# Patient Record
Sex: Female | Born: 1950 | Race: White | Hispanic: No | Marital: Married | Smoking: Never smoker
Health system: Southern US, Community
[De-identification: ages and names within clinical notes are randomized; demographics above are authoritative.]

## PROBLEM LIST (undated history)

## (undated) ENCOUNTER — Emergency Department (HOSPITAL_COMMUNITY): Admission: EM | Payer: Medicare PPO | Source: Home / Self Care

## (undated) DIAGNOSIS — C50919 Malignant neoplasm of unspecified site of unspecified female breast: Secondary | ICD-10-CM

## (undated) DIAGNOSIS — I429 Cardiomyopathy, unspecified: Secondary | ICD-10-CM

## (undated) DIAGNOSIS — Z860100 Personal history of colon polyps, unspecified: Secondary | ICD-10-CM

## (undated) DIAGNOSIS — E785 Hyperlipidemia, unspecified: Secondary | ICD-10-CM

## (undated) DIAGNOSIS — C801 Malignant (primary) neoplasm, unspecified: Secondary | ICD-10-CM

## (undated) DIAGNOSIS — M8580 Other specified disorders of bone density and structure, unspecified site: Secondary | ICD-10-CM

## (undated) DIAGNOSIS — F419 Anxiety disorder, unspecified: Secondary | ICD-10-CM

## (undated) HISTORY — DX: Other specified disorders of bone density and structure, unspecified site: M85.80

## (undated) HISTORY — DX: Hyperlipidemia, unspecified: E78.5

## (undated) HISTORY — DX: Anxiety disorder, unspecified: F41.9

## (undated) HISTORY — DX: Personal history of colon polyps, unspecified: Z86.0100

## (undated) HISTORY — PX: MASTECTOMY: SHX3

## (undated) HISTORY — DX: Personal history of colonic polyps: Z86.010

---

## 1998-04-03 ENCOUNTER — Other Ambulatory Visit: Admission: RE | Admit: 1998-04-03 | Discharge: 1998-04-03 | Payer: Self-pay | Admitting: *Deleted

## 1998-11-06 ENCOUNTER — Encounter: Admission: RE | Admit: 1998-11-06 | Discharge: 1998-11-27 | Payer: Self-pay | Admitting: Oncology

## 1998-12-31 ENCOUNTER — Other Ambulatory Visit: Admission: RE | Admit: 1998-12-31 | Discharge: 1998-12-31 | Payer: Self-pay | Admitting: Gastroenterology

## 1999-01-19 ENCOUNTER — Ambulatory Visit (HOSPITAL_COMMUNITY): Admission: RE | Admit: 1999-01-19 | Discharge: 1999-01-19 | Payer: Self-pay | Admitting: Gastroenterology

## 1999-09-16 ENCOUNTER — Other Ambulatory Visit: Admission: RE | Admit: 1999-09-16 | Discharge: 1999-09-16 | Payer: Self-pay | Admitting: *Deleted

## 2000-09-21 ENCOUNTER — Other Ambulatory Visit: Admission: RE | Admit: 2000-09-21 | Discharge: 2000-09-21 | Payer: Self-pay | Admitting: *Deleted

## 2001-11-28 ENCOUNTER — Other Ambulatory Visit: Admission: RE | Admit: 2001-11-28 | Discharge: 2001-11-28 | Payer: Self-pay | Admitting: Obstetrics and Gynecology

## 2002-04-04 ENCOUNTER — Ambulatory Visit (HOSPITAL_COMMUNITY): Admission: RE | Admit: 2002-04-04 | Discharge: 2002-04-04 | Payer: Self-pay | Admitting: Gastroenterology

## 2014-04-11 ENCOUNTER — Emergency Department (HOSPITAL_COMMUNITY): Payer: Medicare PPO

## 2014-04-11 ENCOUNTER — Encounter (HOSPITAL_COMMUNITY): Payer: Self-pay | Admitting: Emergency Medicine

## 2014-04-11 ENCOUNTER — Emergency Department (HOSPITAL_COMMUNITY)
Admission: EM | Admit: 2014-04-11 | Discharge: 2014-04-11 | Disposition: A | Discharge status: Home / Self Care | Payer: Medicare PPO | Attending: Emergency Medicine | Admitting: Emergency Medicine

## 2014-04-11 DIAGNOSIS — S52501A Unspecified fracture of the lower end of right radius, initial encounter for closed fracture: Secondary | ICD-10-CM | POA: Diagnosis not present

## 2014-04-11 DIAGNOSIS — Y9301 Activity, walking, marching and hiking: Secondary | ICD-10-CM | POA: Diagnosis not present

## 2014-04-11 DIAGNOSIS — Z853 Personal history of malignant neoplasm of breast: Secondary | ICD-10-CM | POA: Insufficient documentation

## 2014-04-11 DIAGNOSIS — Y998 Other external cause status: Secondary | ICD-10-CM | POA: Diagnosis not present

## 2014-04-11 DIAGNOSIS — T1490XA Injury, unspecified, initial encounter: Secondary | ICD-10-CM

## 2014-04-11 DIAGNOSIS — W010XXA Fall on same level from slipping, tripping and stumbling without subsequent striking against object, initial encounter: Secondary | ICD-10-CM | POA: Insufficient documentation

## 2014-04-11 DIAGNOSIS — S6981XA Other specified injuries of right wrist, hand and finger(s), initial encounter: Secondary | ICD-10-CM | POA: Diagnosis present

## 2014-04-11 DIAGNOSIS — Y9289 Other specified places as the place of occurrence of the external cause: Secondary | ICD-10-CM | POA: Insufficient documentation

## 2014-04-11 HISTORY — DX: Malignant (primary) neoplasm, unspecified: C80.1

## 2014-04-11 HISTORY — DX: Malignant neoplasm of unspecified site of unspecified female breast: C50.919

## 2014-04-11 MED ORDER — HYDROCODONE-ACETAMINOPHEN 5-325 MG PO TABS: 1.0000 | ORAL_TABLET | ORAL | Status: DC | PRN

## 2014-04-11 NOTE — ED Provider Notes (Signed)
CSN: [id number]     Arrival date & time 04/11/14  1433 History  This chart is scribed for non-physician practitioner, Clayton Bibles, PA-C, working with Varney Biles, MD by Chester Holstein, ED Scribe.  This patient was seen in room WTR6/WTR6 and the patient's care was started 3:20 PM.    Chief Complaint  Patient presents with  . Wrist Injury   Patient is a 63 y.o. female presenting with wrist injury. The history is provided by the patient. No language interpreter was used.  Wrist Injury   HPI Comments: Jenna Stewart is a 63 y.o. female who presents to the Emergency Department complaining of constant right wrist pain following a fall PTA. Pt notes she used her arm to stop impact with the concrete ground ([place]).  She states her wrist is painful to move, but otherwise has no pain. She denies numbness in fingers. Pt notes she initially felt lightheaded, but was ok after resting for 1-2 minutes. Pt is right handed and is not currently employed. Pt denies LOC, disorientation, confusion after the fall.  Past Medical History  Diagnosis Date  . Cancer   . Breast cancer    Past Surgical History  Procedure Laterality Date  . Mastectomy     No family history on file. History  Substance Use Topics  . Smoking status: Not on file  . Smokeless tobacco: Not on file  . Alcohol Use: Not on file   OB History    No data available     Review of Systems  Musculoskeletal: Positive for joint swelling and arthralgias.  Neurological: Positive for dizziness and numbness. Negative for syncope and weakness.  All other systems reviewed and are negative.     Allergies  Review of patient's allergies indicates not on file.  Home Medications   Prior to Admission medications   Not on File   BP 140/57 mmHg  Pulse 94  Temp(Src) 97.1 F (36.2 C) (Oral)  Resp 14  Ht 5\' 5"  (1.651 m)  Wt 132 lb (59.875 kg)  BMI 21.97 kg/m2  SpO2 100% Physical Exam  Constitutional: She is oriented to person, place,  and time. She appears well-developed and well-nourished. No distress.  HENT:  Head: Normocephalic and atraumatic.  Eyes: Conjunctivae are normal.  Neck: Normal range of motion. Neck supple.  Cardiovascular: Intact distal pulses.   Capillary refill less than 2 sec, radial pulses intact.  Pulmonary/Chest: Effort normal.  Musculoskeletal: Normal range of motion.  Right wrist with swelling, diffused tenderness over dorsal aspect. Decreased ROM secondary to pain. No other bony tenderness, ROM normal in all digits.   Spine nontender, no crepitus, or stepoffs.   Neurological: She is alert and oriented to person, place, and time.  Sensations intact  Skin: Skin is warm and dry. She is not diaphoretic.  Psychiatric: She has a normal mood and affect. Her behavior is normal.  Nursing note and vitals reviewed.   ED Course  Procedures (including critical care time) DIAGNOSTIC STUDIES: Oxygen Saturation is 100% on room air, normal by my interpretation.    COORDINATION OF CARE: 3:26 PM Discussed treatment plan with patient at beside, the patient agrees with the plan and has no further questions at this time.   Labs Review Labs Reviewed - No data to display  Imaging Review Dg Wrist Complete Right  04/11/2014   CLINICAL DATA:  Pain and wrist deformity secondary to a fall at home.  EXAM: RIGHT WRIST - COMPLETE 3+ VIEW  COMPARISON:  None.  FINDINGS:  There is a dorsally impacted slightly comminuted fracture of the metaphysis of the distal right radius. The ulna and the carpal bones are intact. There is no definitive involvement of the distal radial articular surface.  IMPRESSION: Dorsally impacted comminuted fracture of the metaphysis of the distal right radius.   Electronically Signed   By: Rozetta Nunnery M.D.   On: 04/11/2014 15:19   Dg Hand 2 View Right  04/11/2014   CLINICAL DATA:  Pain and deformities secondary to a fall at home today.  EXAM: RIGHT HAND - 2 VIEW  COMPARISON:  None.  FINDINGS:  There is a dorsally impacted comminuted fracture of the metaphysis of the distal right radius. Bones of the hand are intact. Distal ulna is intact.  IMPRESSION: Distal radial fracture.   Electronically Signed   By: Rozetta Nunnery M.D.   On: 04/11/2014 15:20     EKG Interpretation None     3:30 PM Discussed Pt and plan with Dr. Kathrynn Humble.  MDM   Final diagnoses:  Distal radius fracture, right, closed, initial encounter   Afebrile, nontoxic patient with [place], right wrist injury.  Distal right radius fracture sustained.  No other injury.  Neurovascularly intact.   D/C home with sugartong splint, pain medication, hand surgery follow up.   Discussed result, findings, treatment, and follow up  with patient.  Pt given return precautions.  Pt verbalizes understanding and agrees with plan.       I personally performed the services described in this documentation, which was scribed in my presence. The recorded information has been reviewed and is accurate.     Clayton Bibles, PA-C 04/11/14 [place], MD 04/11/14 2203

## 2014-04-11 NOTE — Discharge Instructions (Signed)
Read the information below.  Use the prescribed medication as directed.  Please discuss all new medications with your pharmacist.  Do not take additional tylenol while taking the prescribed pain medication to avoid overdose.  You may return to the Emergency Department at any time for worsening condition or any new symptoms that concern you.  If you develop uncontrolled pain, weakness or numbness of the extremity, severe discoloration of the skin, or you are unable to move your fingers, return to the ER for a recheck.       Forearm Fracture Your caregiver has diagnosed you as having a broken bone (fracture) of the forearm. This is the part of your arm between the elbow and your wrist. Your forearm is made up of two bones. These are the radius and ulna. A fracture is a break in one or both bones. A cast or splint is used to protect and keep your injured bone from moving. The cast or splint will be on generally for about 5 to 6 weeks, with individual variations. HOME CARE INSTRUCTIONS   Keep the injured part elevated while sitting or lying down. Keeping the injury above the level of your heart (the center of the chest). This will decrease swelling and pain.  Apply ice to the injury for 15-20 minutes, 03-04 times per day while awake, for 2 days. Put the ice in a plastic bag and place a thin towel between the bag of ice and your cast or splint.  If you have a plaster or fiberglass cast:  Do not try to scratch the skin under the cast using sharp or pointed objects.  Check the skin around the cast every day. You may put lotion on any red or sore areas.  Keep your cast dry and clean.  If you have a plaster splint:  Wear the splint as directed.  You may loosen the elastic around the splint if your fingers become numb, tingle, or turn cold or blue.  Do not put pressure on any part of your cast or splint. It may break. Rest your cast only on a pillow the first 24 hours until it is fully  hardened.  Your cast or splint can be protected during bathing with a plastic bag. Do not lower the cast or splint into water.  Only take over-the-counter or prescription medicines for pain, discomfort, or fever as directed by your caregiver. SEEK IMMEDIATE MEDICAL CARE IF:   Your cast gets damaged or breaks.  You have more severe pain or swelling than you did before the cast.  Your skin or nails below the injury turn blue or gray, or feel cold or numb.  There is a bad smell or new stains and/or pus like (purulent) drainage coming from under the cast. MAKE SURE YOU:   Understand these instructions.  Will watch your condition.  Will get help right away if you are not doing well or get worse. Document Released: 05/07/2000 Document Revised: 08/02/2011 Document Reviewed: 12/28/2007 [facility] Patient Information 2015 [place], [place]. This information is not intended to replace advice given to you by your health care provider. Make sure you discuss any questions you have with your health care provider.  Cast or Splint Care Casts and splints support injured limbs and keep bones from moving while they heal. It is important to care for your cast or splint at home.  HOME CARE INSTRUCTIONS  Keep the cast or splint uncovered during the drying period. It can take 24 to 48 hours to  dry if it is made of plaster. A fiberglass cast will dry in less than 1 hour.  Do not rest the cast on anything harder than a pillow for the first 24 hours.  Do not put weight on your injured limb or apply pressure to the cast until your health care provider gives you permission.  Keep the cast or splint dry. Wet casts or splints can lose their shape and may not support the limb as well. A wet cast that has lost its shape can also create harmful pressure on your skin when it dries. Also, wet skin can become infected.  Cover the cast or splint with a plastic bag when bathing or when out in the rain or snow. If the  cast is on the trunk of the body, take sponge baths until the cast is removed.  If your cast does become wet, dry it with a towel or a blow dryer on the cool setting only.  Keep your cast or splint clean. Soiled casts may be wiped with a moistened cloth.  Do not place any hard or soft foreign objects under your cast or splint, such as cotton, toilet paper, lotion, or powder.  Do not try to scratch the skin under the cast with any object. The object could get stuck inside the cast. Also, scratching could lead to an infection. If itching is a problem, use a blow dryer on a cool setting to relieve discomfort.  Do not trim or cut your cast or remove padding from inside of it.  Exercise all joints next to the injury that are not immobilized by the cast or splint. For example, if you have a long leg cast, exercise the hip joint and toes. If you have an arm cast or splint, exercise the shoulder, elbow, thumb, and fingers.  Elevate your injured arm or leg on 1 or 2 pillows for the first 1 to 3 days to decrease swelling and pain.It is best if you can comfortably elevate your cast so it is higher than your heart. SEEK MEDICAL CARE IF:   Your cast or splint cracks.  Your cast or splint is too tight or too loose.  You have unbearable itching inside the cast.  Your cast becomes wet or develops a soft spot or area.  You have a bad smell coming from inside your cast.  You get an object stuck under your cast.  Your skin around the cast becomes red or raw.  You have new pain or worsening pain after the cast has been applied. SEEK IMMEDIATE MEDICAL CARE IF:   You have fluid leaking through the cast.  You are unable to move your fingers or toes.  You have discolored (blue or white), cool, painful, or very swollen fingers or toes beyond the cast.  You have tingling or numbness around the injured area.  You have severe pain or pressure under the cast.  You have any difficulty with your  breathing or have shortness of breath.  You have chest pain. Document Released: 05/07/2000 Document Revised: 02/28/2013 Document Reviewed: 11/16/2012 [facility] Patient Information 2015 [place], [place]. This information is not intended to replace advice given to you by your health care provider. Make sure you discuss any questions you have with your health care provider.

## 2014-04-11 NOTE — ED Notes (Signed)
Pt reports falling today when walking outside. Pt presents with obvious  right wrist deformity and swelling. Pt is on aspirin daily 81 mg. Pt denies head injury or loc.

## 2014-05-30 ENCOUNTER — Ambulatory Visit: Payer: Medicare PPO

## 2014-06-03 ENCOUNTER — Ambulatory Visit: Payer: Medicare PPO | Attending: Orthopaedic Surgery | Admitting: Occupational Therapy

## 2014-06-03 ENCOUNTER — Encounter: Payer: Self-pay | Admitting: Occupational Therapy

## 2014-06-03 DIAGNOSIS — X58XXXD Exposure to other specified factors, subsequent encounter: Secondary | ICD-10-CM | POA: Insufficient documentation

## 2014-06-03 DIAGNOSIS — S62101D Fracture of unspecified carpal bone, right wrist, subsequent encounter for fracture with routine healing: Secondary | ICD-10-CM | POA: Insufficient documentation

## 2014-06-03 DIAGNOSIS — S62101B Fracture of unspecified carpal bone, right wrist, initial encounter for open fracture: Secondary | ICD-10-CM

## 2014-06-03 NOTE — Therapy (Signed)
[facility] [address] [place], [place], [address] Phone: [phone]   Fax:  [phone]  Occupational Therapy Evaluation  Patient Details  Name: Jenna Stewart MRN: [id number] Date of Birth: 1950-09-06 Referring Provider:  Garald Balding, MD  Encounter Date: 06/03/2014      OT End of Session - 06/03/14 1700    Visit Number 1   Number of Visits 1   OT Start Time 1616   OT Stop Time 1658   OT Time Calculation (min) 42 min   Activity Tolerance Patient tolerated treatment well      Past Medical History  Diagnosis Date  . Cancer   . Breast cancer     Past Surgical History  Procedure Laterality Date  . Mastectomy      There were no vitals taken for this visit.  Visit Diagnosis:  Wrist fracture, right, open, initial encounter - Plan: Ot plan of care cert/re-cert      Subjective Assessment - 06/03/14 1620    Symptoms I was referred for maximum range of motion   Pertinent History see epic snapshot   Currently in Pain? No/denies          [facility] OT Assessment - 06/03/14 1621    Assessment   Diagnosis L wrist fracture   Onset Date 04/16/15   Prior Therapy none   Precautions   Precautions Other (comment)  no strengthening; AROM only at this time per MD   Required Braces or Orthoses --  pt no longer wearing brace   Restrictions   Weight Bearing Restrictions --  no strengthening at this time   Balance Screen   Has the patient fallen in the past 6 months No   Prior Function   Level of Independence Independent with basic ADLs;Independent with homemaking with ambulation   Vocation Retired   ADL   Eating/Feeding Independent   Grooming Independent   Upper Body Bathing Modified independent   [name]   Upper Body Dressing Independent   Lower Body Dressing Independent   Toilet Desloge   ADL comments pt able to complete all ADL activities as she was before.     IADL   Shopping Takes care of all shopping needs independently   Light Housekeeping Does personal laundry completely;Maintains house alone or with occasional assistance   Meal Prep Plans, prepares and serves adequate meals independently  occassional difficulty opening jars   Written Expression   Dominant Hand Right   Handwriting 100% legible   Sensation   Light Touch Appears Intact   Hot/Cold Appears Intact   Additional Comments Pt reports no tingling or impaired sensation during this episode   Coordination   Gross Motor Movements are Fluid and Coordinated Yes   Other 9 hole peg 25.96   Coordination pt reports no functional problems with coordination   Edema   Edema no edema at this point   AROM   Overall AROM  Deficits   Overall AROM Comments Pt with 75% wrist extension, 70% wrist flexion, ulnar deviation WFL, radial deviation 80%, supination 50%, All other AROM WFL's.  MD has not cleared pt for strengthening or PROM at this time.     RUE Strength   Gross Grasp --  unable to assess due to not cleared for strengthening  OT Education - 06/03/14 1700    Education provided Yes   Education Details HEP to increase ROM   Person(s) Educated Patient   Methods Explanation;Demonstration;Handout   Comprehension Verbalized understanding;Returned demonstration          OT Short Term Goals - 06/03/14 1705    OT SHORT TERM GOAL #1   Title --  N/A           OT Long Term Goals - 06/03/14 1705    OT LONG TERM GOAL #1   Title --  N/A               Plan - 06/03/14 1700    Clinical Impression Statement Pt is a 64 year old female s/p fall with wrist fracture. Pt is now presenting with decreased AROM and some limited functional strength in right dominant hand.  MD has only cleared pt for AROM (no strengthening).  Provided pt with AROM HEP and  suggested that when she sees MD in 4 weeks  if she is noticing strength limitations that she may want to consider re eval at that point if MD will allow strengthening. Pt in agreement.   Clinical Impairments Affecting Rehab Potential limited ROM and strength.  See comments above. Pt does not require further skilled OT at this time.   OT Frequency --  N/A   OT Duration --  N/A   OT Treatment/Interventions --  N/A   Plan Provided HEP;  pt does not require any additional skilled OT at this time.          Problem List There are no active problems to display for this patient.   Quay Burow, OTR/L 06/03/2014, 5:08 PM  [facility] [address] [place], [place], [address] Phone: [phone]   Fax:  [phone]

## 2014-06-03 NOTE — Patient Instructions (Signed)
Home Exercise Program:  Do 1-2 times per day as tolerance allows.  Always do within the limits of your pain.  A stretch is fine, pain is not.  STOP if you have pain.  1.  Wrist flexion: Rest hand on table with thumb facing the ceiling.  Bend wrist forward as far as you can (your fingers will open and this is ok).  Hold for count of 5, then return to starting position.  Build up to 15 repetitions, rest, do 10 more.  2. Wrist extension:  Rest hand on table with thumb facing the ceiling.  Bend wrist back as far as you can (your fingers will close and this is ok). Hold for count of 5, then return to starting position.  Build up to 15 repetitions, rest, do 10 more.  3. Radial deviation:  Place hands face down.  Use left hand to stabilize right forearm.  Turn thumb toward your belly.  Hold for count of 5, return to starting position. Build up to 15 repetitions, rest, do 10 more.  4. Supination:  Rest arms on table, palms down.  With both hands at the same time, turn palms up facing toward the ceiling as far as you can go. Try not to turn elbows in or bend at the waist to get your hand over.  Hold for count of 5, return to resting position. Build up to 15 repetitions, rest, do 10 more.

## 2015-03-13 DIAGNOSIS — Z23 Encounter for immunization: Secondary | ICD-10-CM | POA: Diagnosis not present

## 2015-03-13 DIAGNOSIS — R197 Diarrhea, unspecified: Secondary | ICD-10-CM | POA: Diagnosis not present

## 2015-04-09 DIAGNOSIS — R197 Diarrhea, unspecified: Secondary | ICD-10-CM | POA: Diagnosis not present

## 2015-04-21 DIAGNOSIS — Z853 Personal history of malignant neoplasm of breast: Secondary | ICD-10-CM | POA: Diagnosis not present

## 2015-04-21 DIAGNOSIS — M8589 Other specified disorders of bone density and structure, multiple sites: Secondary | ICD-10-CM | POA: Diagnosis not present

## 2015-04-21 DIAGNOSIS — Z1231 Encounter for screening mammogram for malignant neoplasm of breast: Secondary | ICD-10-CM | POA: Diagnosis not present

## 2017-11-01 ENCOUNTER — Encounter (HOSPITAL_COMMUNITY): Payer: Self-pay | Admitting: Family Medicine

## 2017-11-01 ENCOUNTER — Ambulatory Visit (HOSPITAL_COMMUNITY)
Admission: EM | Admit: 2017-11-01 | Discharge: 2017-11-01 | Disposition: A | Discharge status: Home / Self Care | Payer: Medicare Other | Attending: Family Medicine | Admitting: Family Medicine

## 2017-11-01 DIAGNOSIS — S0101XA Laceration without foreign body of scalp, initial encounter: Secondary | ICD-10-CM

## 2017-11-01 DIAGNOSIS — S0990XA Unspecified injury of head, initial encounter: Secondary | ICD-10-CM

## 2017-11-01 DIAGNOSIS — W208XXA Other cause of strike by thrown, projected or falling object, initial encounter: Secondary | ICD-10-CM | POA: Diagnosis not present

## 2017-11-01 NOTE — ED Provider Notes (Signed)
[facility]    CSN: [id number] Arrival date & time: 11/01/17  1403     History   Chief Complaint Chief Complaint  Patient presents with  . Head Injury    HPI Jenna Stewart is a 67 y.o. female.   67 year old female comes in for head injury and laceration to the scalp.  States she was putting down a dead branch when another branch fell and hit her head.  She denies loss of consciousness.  Denies dizziness, weakness, syncope.  Denies nausea, vomiting.  Denies photophobia, blurry vision.  Denies blood thinner use.  Applied pressure to the scalp wound, and followed up here for evaluation.  Tetanus up-to-date.      Past Medical History:  Diagnosis Date  . Breast cancer ([place])   . Cancer ([place])     There are no active problems to display for this patient.   Past Surgical History:  Procedure Laterality Date  . MASTECTOMY      OB History   None      Home Medications    Prior to Admission medications   Medication Sig Start Date End Date Taking? Authorizing Provider  calcium carbonate 1250 MG capsule Take 1,250 mg by mouth 2 (two) times daily with a meal.    [provider]  FLUoxetine (PROZAC) 20 MG capsule Take 20 mg by mouth daily.    [provider]  HYDROcodone-acetaminophen (NORCO/VICODIN) 5-325 MG per tablet Take 1-2 tablets by mouth every 4 (four) hours as needed for moderate pain or severe pain. Patient not taking: Reported on 06/03/2014 04/11/14   Clayton Bibles, PA-C  niacin (NIASPAN) 750 MG CR tablet Take 750 mg by mouth at bedtime.    [provider]  raloxifene (EVISTA) 60 MG tablet Take 60 mg by mouth daily.    [provider]    Family History History reviewed. No pertinent family history.  Social History Social History   Tobacco Use  . Smoking status: Not on file  Substance Use Topics  . Alcohol use: Not on file  . Drug use: Not on file     Allergies   Patient has no known  allergies.   Review of Systems Review of Systems  Reason unable to perform ROS: See HPI as above.     Physical Exam Triage Vital Signs ED Triage Vitals [11/01/17 1505]  Enc Vitals Group     BP (!) 145/69     Pulse Rate 84     Resp 18     Temp      Temp src      SpO2 100 %     Weight      Height      Head Circumference      Peak Flow      Pain Score      Pain Loc      Pain Edu?      Excl. in [place]?    No data found.  Updated Vital Signs BP (!) 145/69   Pulse 84   Resp 18   SpO2 100%   Physical Exam  Constitutional: She is oriented to person, place, and time. She appears well-developed and well-nourished. No distress.  HENT:  Head: Normocephalic and atraumatic.  Eyes: Pupils are equal, round, and reactive to light. Conjunctivae and EOM are normal.  Neck: Normal range of motion. Neck supple.  Cardiovascular: Normal rate and regular rhythm. Exam reveals no gallop and no friction rub.  No murmur heard. Pulmonary/Chest:  Effort normal and breath sounds normal. No stridor. No respiratory distress. She has no wheezes. She has no rales.  Neurological: She is alert and oriented to person, place, and time. She has normal strength. She is not disoriented. No cranial nerve deficit or sensory deficit. She displays a negative Romberg sign. Coordination and gait normal. GCS eye subscore is 4. GCS verbal subscore is 5. GCS motor subscore is 6.  Normal finger-to-nose, heel-to-shin, rapid movement.  Able to ambulate without difficulty or hesitancy.  Able to walk on tiptoes, heels, toe to heels.  Skin: Skin is warm and dry.  3 cm laceration to the right parietal scalp.  Bleeding continues.  No foreign body seen.     UC Treatments / Results  Labs (all labs ordered are listed, but only abnormal results are displayed) Labs Reviewed - No data to display  EKG None  Radiology No results found.  Procedures Laceration Repair Date/Time: 11/01/2017 8:04 PM Performed by: Ok Edwards,  PA-C Authorized by: Raylene Everts, MD   Consent:    Consent obtained:  Verbal   Consent given by:  Patient   Risks discussed:  Infection, pain, poor cosmetic result, poor wound healing and retained foreign body   Alternatives discussed:  Referral Anesthesia (see MAR for exact dosages):    Anesthesia method:  Local infiltration   Local anesthetic:  Lidocaine 2% WITH epi Laceration details:    Location:  Scalp   Scalp location:  R parietal   Length (cm):  3   Depth (mm):  2 Repair type:    Repair type:  Simple Pre-procedure details:    Preparation:  Patient was prepped and draped in usual sterile fashion Exploration:    Hemostasis achieved with:  Epinephrine   Wound exploration: entire depth of wound probed and visualized   Treatment:    Area cleansed with:  Hibiclens   Amount of cleaning:  Extensive   Irrigation solution:  Sterile saline   Irrigation method:  Pressure wash, tap and syringe   Visualized foreign bodies/material removed: no   Skin repair:    Repair method:  Staples   Number of staples:  4 Approximation:    Approximation:  Close Post-procedure details:    Dressing:  Open (no dressing)   Patient tolerance of procedure:  Tolerated well, no immediate complications   (including critical care time)  Medications Ordered in UC Medications - No data to display  Initial Impression / Assessment and Plan / UC Course  I have reviewed the triage vital signs and the nursing notes.  Pertinent labs & imaging results that were available during my care of the patient were reviewed by me and considered in my medical decision making (see chart for details).    Normal neurology exam without deficits.  Long discussion with patient about worries of head injury and brain bleed with patients over 74 years old.  Discussed going to the emergency department for further evaluation.  Patient without dizziness, headache, blurry vision, confusion, weakness at this time. She would  like to defer going to the emergency department, and to follow-up with primary care instead. Will have patient follow-up in 1 to 2 days for reevaluation, and assessment for possible CT scan.   Scalp laceration repaired, 4 staples applied.  Wound care instructions given. Follow-up here with PCP in 7 days for staple removal.  Strict return precautions given.  Patient expresses understanding and agrees to plan.  Final Clinical Impressions(s) / UC Diagnoses   Final diagnoses:  Injury of head, initial encounter  Laceration of scalp, initial encounter    ED Prescriptions    None        Arturo Morton 11/01/17 2009

## 2017-11-01 NOTE — ED Triage Notes (Signed)
Pt here for head injury and laceration to head. She was pulling down a dead branch and another branch came with it striking her in the head. She denies any dizziness, LOC, blurred vision.

## 2017-11-01 NOTE — Discharge Instructions (Signed)
4 staples applied today. Do not get the wound wet for the first 24 hours. Afterwards, you can wash your hair as normal. Do not soak wound. Monitor for spreading redness, increased warmth, drainage, follow up for reevaluation. Follow up here or with PCP in 7 days for staple removal.  As discussed, cannot rule out brain bleed given your head injury and age.  However, given no symptoms, you can follow-up with your primary care in 1 to 2 days for reevaluation, and possible CT scan. If experiencing headache/blurry vision, nausea/vomiting, confusion/altered mental status, dizziness, weakness, passing out, imbalance, go to the emergency department for further evaluation.

## 2018-05-16 ENCOUNTER — Encounter (HOSPITAL_COMMUNITY): Payer: Self-pay | Admitting: Emergency Medicine

## 2018-05-16 ENCOUNTER — Other Ambulatory Visit: Payer: Self-pay

## 2018-05-16 ENCOUNTER — Ambulatory Visit (HOSPITAL_COMMUNITY)
Admission: EM | Admit: 2018-05-16 | Discharge: 2018-05-16 | Disposition: A | Discharge status: Home / Self Care | Payer: Medicare Other | Attending: Family Medicine | Admitting: Family Medicine

## 2018-05-16 DIAGNOSIS — S0502XA Injury of conjunctiva and corneal abrasion without foreign body, left eye, initial encounter: Secondary | ICD-10-CM | POA: Diagnosis not present

## 2018-05-16 MED ORDER — TOBRAMYCIN 0.3 % OP OINT: 1.0000 "application " | TOPICAL_OINTMENT | Freq: Three times a day (TID) | OPHTHALMIC | 0 refills | Status: DC

## 2018-05-16 MED ORDER — EYE WASH OPHTH SOLN
OPHTHALMIC | Status: AC
Start: 2018-05-16 — End: 2018-05-16
  Filled 2018-05-16: qty 236

## 2018-05-16 NOTE — ED Triage Notes (Signed)
PT was mowing and a branch struck her in the left eye. PT reports pain in eye and slight blurred vision.

## 2018-05-16 NOTE — ED Provider Notes (Signed)
[place]    CSN: [id number] Arrival date & time: 05/16/18  1621     History   Chief Complaint Chief Complaint  Patient presents with  . Eye Problem    HPI Jenna Stewart is a 67 y.o. female.   This is an established patient, 67 year old woman.  PT was mowing and a branch struck her in the left eye. PT reports pain in eye and slight blurred vision.   No foreign body sensation.     Past Medical History:  Diagnosis Date  . Breast cancer ([place])   . Cancer ([place])     There are no active problems to display for this patient.   Past Surgical History:  Procedure Laterality Date  . MASTECTOMY      OB History   No obstetric history on file.      Home Medications    Prior to Admission medications   Medication Sig Start Date End Date Taking? Authorizing Provider  FLUoxetine (PROZAC) 20 MG capsule Take 20 mg by mouth daily.   Yes [provider]  niacin (NIASPAN) 750 MG CR tablet Take 750 mg by mouth at bedtime.   Yes [provider]  raloxifene (EVISTA) 60 MG tablet Take 60 mg by mouth daily.   Yes [provider]  calcium carbonate 1250 MG capsule Take 1,250 mg by mouth 2 (two) times daily with a meal.    [provider]  tobramycin (TOBREX) 0.3 % ophthalmic ointment Place 1 application into the left eye 3 (three) times daily. 05/16/18   Robyn Haber, MD    Family History No family history on file.  Social History Social History   Tobacco Use  . Smoking status: Not on file  Substance Use Topics  . Alcohol use: Not on file  . Drug use: Not on file     Allergies   Patient has no known allergies.   Review of Systems Review of Systems   Physical Exam Triage Vital Signs ED Triage Vitals  Enc Vitals Group     BP 05/16/18 1643 (!) 144/69     Pulse Rate 05/16/18 1642 82     Resp 05/16/18 1642 16     Temp 05/16/18 1642 97.8 F (36.6 C)     Temp Source 05/16/18 1642 Oral     SpO2 05/16/18 1642  100 %     Weight --      Height --      Head Circumference --      Peak Flow --      Pain Score 05/16/18 1641 3     Pain Loc --      Pain Edu? --      Excl. in [place]? --    No data found.  Updated Vital Signs BP (!) 144/69   Pulse 82   Temp 97.8 F (36.6 C) (Oral)   Resp 16   SpO2 100%   Visual Acuity Right Eye Distance: 20/50 Left Eye Distance: 20/70 Bilateral Distance: 20/50     Physical Exam Vitals signs and nursing note reviewed.  Constitutional:      Appearance: Normal appearance.  HENT:     Head: Normocephalic.     Nose: Congestion present.     Mouth/Throat:     Mouth: Mucous membranes are moist.  Eyes:     Comments: Mild erythema and left sclera Lateral lower quadrant of left cornea shows superficial abrasion when stained with floor seen. I thoroughly irrigated.  Pulmonary:  Effort: Pulmonary effort is normal.  Musculoskeletal: Normal range of motion.  Skin:    General: Skin is warm and dry.  Neurological:     General: No focal deficit present.     Mental Status: She is alert.  Psychiatric:        Mood and Affect: Mood normal.      UC Treatments / Results  Labs (all labs ordered are listed, but only abnormal results are displayed) Labs Reviewed - No data to display  EKG None  Radiology No results found.  Procedures Procedures (including critical care time)  Medications Ordered in UC Medications - No data to display  Initial Impression / Assessment and Plan / UC Course  I have reviewed the triage vital signs and the nursing notes.  Pertinent labs & imaging results that were available during my care of the patient were reviewed by me and considered in my medical decision making (see chart for details).    Final Clinical Impressions(s) / UC Diagnoses   Final diagnoses:  Abrasion of left cornea, initial encounter   Discharge Instructions   None    ED Prescriptions    Medication Sig Dispense Auth. Provider   tobramycin (TOBREX)  0.3 % ophthalmic ointment Place 1 application into the left eye 3 (three) times daily. 3.5 g Robyn Haber, MD     Controlled Substance Prescriptions [place] Controlled Substance Registry consulted? Not Applicable   Robyn Haber, MD 05/16/18 [phone]

## 2019-07-01 ENCOUNTER — Ambulatory Visit: Payer: Medicare PPO | Attending: Internal Medicine

## 2019-07-01 DIAGNOSIS — Z23 Encounter for immunization: Secondary | ICD-10-CM | POA: Insufficient documentation

## 2019-07-01 NOTE — Progress Notes (Signed)
   Covid-19 Vaccination Clinic  Name:  Jenna Stewart    MRN: [id number] DOB: 07-19-1950  07/01/2019  [name] was observed post Covid-19 immunization for 15 minutes without incidence. She was provided with Vaccine Information Sheet and instruction to access the V-Safe system.   [name] was instructed to call 911 with any severe reactions post vaccine: Marland Kitchen Difficulty breathing  . Swelling of your face and throat  . A fast heartbeat  . A bad rash all over your body  . Dizziness and weakness    Immunizations Administered    Name Date Dose VIS Date Route   Pfizer COVID-19 Vaccine 07/01/2019  5:02 PM 0.3 mL 05/04/2019 Intramuscular   Manufacturer: [place]   Lot: [id number]   [place]: [id number]

## 2019-07-18 ENCOUNTER — Ambulatory Visit: Payer: Medicare Other

## 2019-07-26 ENCOUNTER — Ambulatory Visit: Payer: Medicare PPO | Attending: Internal Medicine

## 2019-07-26 DIAGNOSIS — Z23 Encounter for immunization: Secondary | ICD-10-CM

## 2019-07-26 NOTE — Progress Notes (Signed)
   Covid-19 Vaccination Clinic  Name:  Jenna Stewart    MRN: [id number] DOB: 1950/06/04  07/26/2019  [name] was observed post Covid-19 immunization for 15 minutes without incident. She was provided with Vaccine Information Sheet and instruction to access the V-Safe system.   [name] was instructed to call 911 with any severe reactions post vaccine: Marland Kitchen Difficulty breathing  . Swelling of face and throat  . A fast heartbeat  . A bad rash all over body  . Dizziness and weakness   Immunizations Administered    Name Date Dose VIS Date Route   Pfizer COVID-19 Vaccine 07/26/2019  1:37 PM 0.3 mL 05/04/2019 Intramuscular   Manufacturer: [place]   Lot: [id number]   [place]: [id number]

## 2019-09-26 ENCOUNTER — Other Ambulatory Visit: Payer: Self-pay | Admitting: Radiology

## 2019-11-15 DIAGNOSIS — L308 Other specified dermatitis: Secondary | ICD-10-CM | POA: Diagnosis not present

## 2019-11-15 DIAGNOSIS — D225 Melanocytic nevi of trunk: Secondary | ICD-10-CM | POA: Diagnosis not present

## 2019-11-15 DIAGNOSIS — L821 Other seborrheic keratosis: Secondary | ICD-10-CM | POA: Diagnosis not present

## 2019-11-15 DIAGNOSIS — L570 Actinic keratosis: Secondary | ICD-10-CM | POA: Diagnosis not present

## 2020-03-26 DIAGNOSIS — R922 Inconclusive mammogram: Secondary | ICD-10-CM | POA: Diagnosis not present

## 2020-03-26 DIAGNOSIS — R928 Other abnormal and inconclusive findings on diagnostic imaging of breast: Secondary | ICD-10-CM | POA: Diagnosis not present

## 2020-06-06 DIAGNOSIS — J069 Acute upper respiratory infection, unspecified: Secondary | ICD-10-CM | POA: Diagnosis not present

## 2020-09-03 DIAGNOSIS — E7800 Pure hypercholesterolemia, unspecified: Secondary | ICD-10-CM | POA: Diagnosis not present

## 2020-09-03 DIAGNOSIS — F419 Anxiety disorder, unspecified: Secondary | ICD-10-CM | POA: Diagnosis not present

## 2020-09-03 DIAGNOSIS — Z853 Personal history of malignant neoplasm of breast: Secondary | ICD-10-CM | POA: Diagnosis not present

## 2020-09-03 DIAGNOSIS — Z0000 Encounter for general adult medical examination without abnormal findings: Secondary | ICD-10-CM | POA: Diagnosis not present

## 2020-09-03 DIAGNOSIS — M8588 Other specified disorders of bone density and structure, other site: Secondary | ICD-10-CM | POA: Diagnosis not present

## 2020-10-13 DIAGNOSIS — R921 Mammographic calcification found on diagnostic imaging of breast: Secondary | ICD-10-CM | POA: Diagnosis not present

## 2020-11-14 DIAGNOSIS — D485 Neoplasm of uncertain behavior of skin: Secondary | ICD-10-CM | POA: Diagnosis not present

## 2020-11-14 DIAGNOSIS — L821 Other seborrheic keratosis: Secondary | ICD-10-CM | POA: Diagnosis not present

## 2020-11-14 DIAGNOSIS — L308 Other specified dermatitis: Secondary | ICD-10-CM | POA: Diagnosis not present

## 2020-11-14 DIAGNOSIS — L570 Actinic keratosis: Secondary | ICD-10-CM | POA: Diagnosis not present

## 2020-11-14 DIAGNOSIS — D225 Melanocytic nevi of trunk: Secondary | ICD-10-CM | POA: Diagnosis not present

## 2020-11-14 DIAGNOSIS — L814 Other melanin hyperpigmentation: Secondary | ICD-10-CM | POA: Diagnosis not present

## 2021-05-11 DIAGNOSIS — R921 Mammographic calcification found on diagnostic imaging of breast: Secondary | ICD-10-CM | POA: Diagnosis not present

## 2021-05-11 DIAGNOSIS — R928 Other abnormal and inconclusive findings on diagnostic imaging of breast: Secondary | ICD-10-CM | POA: Diagnosis not present

## 2021-06-18 DIAGNOSIS — R197 Diarrhea, unspecified: Secondary | ICD-10-CM | POA: Diagnosis not present

## 2021-06-18 DIAGNOSIS — Z86010 Personal history of colonic polyps: Secondary | ICD-10-CM | POA: Diagnosis not present

## 2021-08-04 DIAGNOSIS — K52831 Collagenous colitis: Secondary | ICD-10-CM | POA: Diagnosis not present

## 2021-08-04 DIAGNOSIS — K5730 Diverticulosis of large intestine without perforation or abscess without bleeding: Secondary | ICD-10-CM | POA: Diagnosis not present

## 2021-08-04 DIAGNOSIS — R197 Diarrhea, unspecified: Secondary | ICD-10-CM | POA: Diagnosis not present

## 2021-08-06 DIAGNOSIS — K52831 Collagenous colitis: Secondary | ICD-10-CM | POA: Diagnosis not present

## 2021-08-11 DIAGNOSIS — J302 Other seasonal allergic rhinitis: Secondary | ICD-10-CM | POA: Diagnosis not present

## 2021-08-21 ENCOUNTER — Other Ambulatory Visit: Payer: Self-pay | Admitting: Family Medicine

## 2021-08-21 ENCOUNTER — Ambulatory Visit
Admission: RE | Admit: 2021-08-21 | Discharge: 2021-08-21 | Disposition: A | Discharge status: Home / Self Care | Payer: Medicare PPO | Source: Ambulatory Visit | Attending: Family Medicine | Admitting: Family Medicine

## 2021-08-21 DIAGNOSIS — J40 Bronchitis, not specified as acute or chronic: Secondary | ICD-10-CM | POA: Diagnosis not present

## 2021-08-21 DIAGNOSIS — R059 Cough, unspecified: Secondary | ICD-10-CM | POA: Diagnosis not present

## 2021-08-21 DIAGNOSIS — R0602 Shortness of breath: Secondary | ICD-10-CM | POA: Diagnosis not present

## 2021-08-21 DIAGNOSIS — J811 Chronic pulmonary edema: Secondary | ICD-10-CM | POA: Diagnosis not present

## 2021-09-11 DIAGNOSIS — R0989 Other specified symptoms and signs involving the circulatory and respiratory systems: Secondary | ICD-10-CM | POA: Diagnosis not present

## 2021-09-11 DIAGNOSIS — I517 Cardiomegaly: Secondary | ICD-10-CM | POA: Diagnosis not present

## 2021-09-14 DIAGNOSIS — Z853 Personal history of malignant neoplasm of breast: Secondary | ICD-10-CM | POA: Diagnosis not present

## 2021-09-14 DIAGNOSIS — E559 Vitamin D deficiency, unspecified: Secondary | ICD-10-CM | POA: Diagnosis not present

## 2021-09-14 DIAGNOSIS — F419 Anxiety disorder, unspecified: Secondary | ICD-10-CM | POA: Diagnosis not present

## 2021-09-14 DIAGNOSIS — M8588 Other specified disorders of bone density and structure, other site: Secondary | ICD-10-CM | POA: Diagnosis not present

## 2021-09-14 DIAGNOSIS — E7800 Pure hypercholesterolemia, unspecified: Secondary | ICD-10-CM | POA: Diagnosis not present

## 2021-09-14 DIAGNOSIS — R600 Localized edema: Secondary | ICD-10-CM | POA: Diagnosis not present

## 2021-09-14 DIAGNOSIS — R5383 Other fatigue: Secondary | ICD-10-CM | POA: Diagnosis not present

## 2021-09-14 DIAGNOSIS — Z0000 Encounter for general adult medical examination without abnormal findings: Secondary | ICD-10-CM | POA: Diagnosis not present

## 2021-09-16 ENCOUNTER — Encounter: Payer: Self-pay | Admitting: Cardiovascular Disease

## 2021-09-16 ENCOUNTER — Ambulatory Visit: Payer: Medicare PPO | Admitting: Cardiovascular Disease

## 2021-09-16 VITALS — BP 122/75 | HR 119 | Ht 65.0 in | Wt 143.8 lb

## 2021-09-16 DIAGNOSIS — R000 Tachycardia, unspecified: Secondary | ICD-10-CM | POA: Diagnosis not present

## 2021-09-16 DIAGNOSIS — I5041 Acute combined systolic (congestive) and diastolic (congestive) heart failure: Secondary | ICD-10-CM | POA: Diagnosis not present

## 2021-09-16 MED ORDER — CARVEDILOL 3.125 MG PO TABS: 3.1250 mg | ORAL_TABLET | Freq: Two times a day (BID) | ORAL | 3 refills | Status: DC

## 2021-09-16 MED ORDER — POTASSIUM CHLORIDE ER 10 MEQ PO TBCR: 10.0000 meq | EXTENDED_RELEASE_TABLET | Freq: Two times a day (BID) | ORAL | 3 refills | Status: DC

## 2021-09-16 MED ORDER — FUROSEMIDE 40 MG PO TABS: 40.0000 mg | ORAL_TABLET | Freq: Every day | ORAL | 3 refills | Status: DC

## 2021-09-16 MED ORDER — EMPAGLIFLOZIN 10 MG PO TABS: 10.0000 mg | ORAL_TABLET | Freq: Every day | ORAL | 3 refills | Status: DC

## 2021-09-16 NOTE — Patient Instructions (Addendum)
Medication Instructions:  ?START Furosemide (Lasix) '40mg'$  daily ?START Carvedilol (Coreg) 3.'125mg'$  twice daily ?START Potassium Chloride (k-dur) 62mq twice daily ?START Jardiance '10mg'$  daily ?*If you need a refill on your cardiac medications before your next appointment, please call your pharmacy* ? ? ?Lab Work: ?BMET tuesday ?If you have labs (blood work) drawn today and your tests are completely normal, you will receive your results only by: ?MyChart Message (if you have MyChart) OR ?A paper copy in the mail ?If you have any lab test that is abnormal or we need to change your treatment, we will call you to review the results. ? ? ?Testing/Procedures: ?NONE ? ? ?Follow-Up: ?[facility] you and your health needs are our priority.  As part of our continuing mission to provide you with exceptional heart care, we have created designated Provider Care Teams.  These Care Teams include your primary Cardiologist (physician) and Advanced Practice Providers (APPs -  Physician Assistants and Nurse Practitioners) who all work together to provide you with the care you need, when you need it. ? ? ?Your next appointment:   ?Sep 22, 2021 @ 11:40a ? ?The format for your next appointment:   ?In Person ? ?Provider:   ?PMertie Moores MD  ?  ? ?Important Information About Sugar ? ? ? ? ?  ?

## 2021-09-16 NOTE — Progress Notes (Addendum)
?Cardiology Office Note:   ? ?Date:  09/16/2021  ? ?ID:  Jenna Stewart, DOB 03-22-1951, MRN [id number] ? ?PCP:  Shirline Frees, MD ?  ?[facility] Providers ?Cardiologist:  Norma Montemurro  ? ? ?Referring MD: Shirline Frees, MD  ? ?Chief Complaint  ?Patient presents with  ? Congestive Heart Failure  ?   ?  ? ? ?History of Present Illness:   ? ?Jenna Stewart is a 71 y.o. female with a hx of CHF , hx of breast cancer,  ? ?Seen with husband, Jenna Stewart  ? ?Has had DOE for the past 4-5 months  ?Was very severe at first, now her symptoms have improved.  ?Not able to do any exercise currently  ?Was short of breath walking in from the parking lot .  ? ?Was eating more salt prior to her diagnosis ?Her LFTs are mildly elevated.  ?May be due to passive congestion .  ?Extreme fatigue  ? ?Addendum ?September 18, 2021 ?I received a copy of the transthoracic echo report which will be scanned in.  The findings are: ? ? severely depressed left ventricular systolic function with visual EF of 25 to 30% ?Unable to evaluate diastolic function ?Mild LAE, mild RAE, mild RV dilatation ?Mild aortic insufficiency ?Mild to moderate mitral regurgitation ?Mild tricuspid regurgitation ? ?Past Medical History:  ?Diagnosis Date  ? Anxiety   ? Breast cancer ([place])   ? Cancer [facility])   ? History of colonic polyps   ? Hyperlipemia   ? Osteopenia   ? ? ?Past Surgical History:  ?Procedure Laterality Date  ? MASTECTOMY    ? ? ?Current Medications: ?Current Meds  ?Medication Sig  ? aspirin EC 81 MG tablet Take 81 mg by mouth daily. Swallow whole.  ? calcium carbonate 1250 MG capsule Take 1,250 mg by mouth 2 (two) times daily with a meal.  ? carvedilol (COREG) 3.125 MG tablet Take 1 tablet (3.125 mg total) by mouth 2 (two) times daily.  ? empagliflozin (JARDIANCE) 10 MG TABS tablet Take 1 tablet (10 mg total) by mouth daily before breakfast.  ? FLUoxetine (PROZAC) 20 MG capsule Take 1 capsule by mouth daily.  ? furosemide (LASIX) 40 MG tablet Take 1 tablet (40 mg  total) by mouth daily.  ? Multiple Vitamin (MULTIVITAMIN) tablet Take 1 tablet by mouth daily.  ? niacin (NIASPAN) 750 MG CR tablet Take 750 mg by mouth at bedtime.  ? potassium chloride (KLOR-CON) 10 MEQ tablet Take 1 tablet (10 mEq total) by mouth 2 (two) times daily.  ? raloxifene (EVISTA) 60 MG tablet Take 60 mg by mouth daily.  ? VITAMIN D, CHOLECALCIFEROL, PO Take 1 capsule by mouth daily.  ? [DISCONTINUED] furosemide (LASIX) 20 MG tablet Take 1 tablet by mouth daily.  ?  ? ?Allergies:   Patient has no known allergies.  ? ?Social History  ? ?Socioeconomic History  ? Marital status: Married  ?  Spouse name: Not on file  ? Number of children: Not on file  ? Years of education: Not on file  ? Highest education level: Not on file  ?Occupational History  ? Not on file  ?Tobacco Use  ? Smoking status: Never  ? Smokeless tobacco: Not on file  ?Substance and Sexual Activity  ? Alcohol use: Never  ? Drug use: Not on file  ? Sexual activity: Not on file  ?Other Topics Concern  ? Not on file  ?Social History Narrative  ? Not on file  ? ?Social Determinants of  Health  ? ?Financial Resource Strain: Not on file  ?Food Insecurity: Not on file  ?Transportation Needs: Not on file  ?Physical Activity: Not on file  ?Stress: Not on file  ?Social Connections: Not on file  ?  ? ?Family History: ?The patient's family history includes Diabetes in her sister; Hypertension in her sister; Ulcerative colitis in her father; Uterine cancer in her maternal grandmother. ? ?ROS:   ?Please see the history of present illness.    ? All other systems reviewed and are negative. ? ?EKGs/Labs/Other Studies Reviewed:   ? ?The following studies were reviewed today: ? ? ?EKG:  September 16, 2021.  Sinus tachcyardia.  Voltage for LVH. NS ST /T abn ? ?Recent Labs: ?No results found for requested labs within last 8760 hours.  ?Recent Lipid Panel ?No results found for: CHOL, TRIG, HDL, CHOLHDL, VLDL, LDLCALC, LDLDIRECT ? ? ?Risk Assessment/Calculations:   ?   ? ?    ? ?Physical Exam:   ? ?VS:  BP 122/75 (BP Location: Right Arm, Patient Position: Sitting, Cuff Size: Normal)   Pulse (!) 119   Ht '5\' 5"'$  (1.651 m)   Wt 143 lb 12.8 oz (65.2 kg)   SpO2 97%   BMI 23.93 kg/m?    ? ?Wt Readings from Last 3 Encounters:  ?09/16/21 143 lb 12.8 oz (65.2 kg)  ?04/11/14 132 lb (59.9 kg)  ?  ? ?GEN:  Well nourished, well developed in no acute distress ?HEENT: Normal ?NECK: mild JVD at 45 degrees , CVP ~10-12 cm  ? No carotid bruits ?LYMPHATICS: No lymphadenopathy ?CARDIAC: RRR, soft systolic murmur,  + S3 gallop  ?RESPIRATORY:  Clear to auscultation without rales, wheezing or rhonchi  ?ABDOMEN: Soft, non-tender, non-distended ?MUSCULOSKELETAL:  No edema; No deformity  ?SKIN: Warm and dry ?NEUROLOGIC:  Alert and oriented x 3 ?PSYCHIATRIC:  Normal affect  ? ?ASSESSMENT:   ? ?1. Acute combined systolic and diastolic congestive heart failure ([place])   ?2. Tachycardia, unspecified   ? ?PLAN:   ? ?In order of problems listed above: ? ?Acute combined CHF:   EF reportely 25%.  I am unable to see the echo that was performed at [facility].   She has an S3 gallop, is very tachycardic .   ?Will add coreg 3.125 BID ?Increase Lasix to 40 mg a day  ?Add Kdur 10 BID  ?Jardiance 10 mg a day  ? ?Will have her return in 1 week  ( Sep 21, 2021) ? ?Will anticipate adding Entresto, spironolactone ,  titrating  the coreg at future visits  ?Will repeat echo once her HR is closer to normal  ? ? ?   ? ?   ? ? ?Medication Adjustments/Labs and Tests Ordered: ?Current medicines are reviewed at length with the patient today.  Concerns regarding medicines are outlined above.  ?Orders Placed This Encounter  ?Procedures  ? Basic metabolic panel  ? EKG 12-Lead  ? ?Meds ordered this encounter  ?Medications  ? furosemide (LASIX) 40 MG tablet  ?  Sig: Take 1 tablet (40 mg total) by mouth daily.  ?  Dispense:  90 tablet  ?  Refill:  3  ?  Dose INCREASE  ? carvedilol (COREG) 3.125 MG tablet  ?  Sig: Take 1 tablet (3.125 mg  total) by mouth 2 (two) times daily.  ?  Dispense:  180 tablet  ?  Refill:  3  ? potassium chloride (KLOR-CON) 10 MEQ tablet  ?  Sig: Take 1 tablet (10 mEq  total) by mouth 2 (two) times daily.  ?  Dispense:  180 tablet  ?  Refill:  3  ? empagliflozin (JARDIANCE) 10 MG TABS tablet  ?  Sig: Take 1 tablet (10 mg total) by mouth daily before breakfast.  ?  Dispense:  90 tablet  ?  Refill:  3  ? ? ? ?Patient Instructions  ?Medication Instructions:  ?START Furosemide (Lasix) '40mg'$  daily ?START Carvedilol (Coreg) 3.'125mg'$  twice daily ?START Potassium Chloride (k-dur) 6mq twice daily ?START Jardiance '10mg'$  daily ?*If you need a refill on your cardiac medications before your next appointment, please call your pharmacy* ? ? ?Lab Work: ?BMET tuesday ?If you have labs (blood work) drawn today and your tests are completely normal, you will receive your results only by: ?MyChart Message (if you have MyChart) OR ?A paper copy in the mail ?If you have any lab test that is abnormal or we need to change your treatment, we will call you to review the results. ? ? ?Testing/Procedures: ?NONE ? ? ?Follow-Up: ?[facility] are our [facility].  As part of our continuing mission to provide you with exceptional heart care, we have created designated Provider Care Teams.  These Care Teams include your primary Cardiologist (physician) and Advanced Practice Providers (APPs -  Physician Assistants and Nurse Practitioners) who all work together to provide you with the care you need, when you need it. ? ? ?Your next appointment:   ?Sep 22, 2021 @ 11:40a ? ?The format for your next appointment:   ?In Person ? ?Provider:   ?PMertie Moores MD  ?  ? ?Important Information About Sugar ? ? ? ? ?   ? ?Signed, ?PMertie Moores MD  ?09/16/2021 5:18 PM    ?[facility]? ?

## 2021-09-22 ENCOUNTER — Ambulatory Visit: Payer: Medicare PPO | Admitting: Cardiovascular Disease

## 2021-09-22 ENCOUNTER — Other Ambulatory Visit: Payer: Medicare PPO | Admitting: *Deleted

## 2021-09-22 ENCOUNTER — Encounter: Payer: Self-pay | Admitting: Cardiovascular Disease

## 2021-09-22 VITALS — BP 104/66 | HR 111 | Ht 65.0 in | Wt 141.4 lb

## 2021-09-22 DIAGNOSIS — Z79899 Other long term (current) drug therapy: Secondary | ICD-10-CM

## 2021-09-22 DIAGNOSIS — I5041 Acute combined systolic (congestive) and diastolic (congestive) heart failure: Secondary | ICD-10-CM

## 2021-09-22 DIAGNOSIS — R000 Tachycardia, unspecified: Secondary | ICD-10-CM | POA: Diagnosis not present

## 2021-09-22 MED ORDER — VALSARTAN 40 MG PO TABS: 40.0000 mg | ORAL_TABLET | Freq: Every day | ORAL | 0 refills | Status: DC

## 2021-09-22 NOTE — Progress Notes (Signed)
?Cardiology Office Note:   ? ?Date:  09/22/2021  ? ?ID:  Jenna Stewart, DOB [birth year], MRN [id number] ? ?PCP:  Shirline Frees, MD ?  ?[facility] Providers ?Cardiologist:  Pritika Alvarez  ? ? ?Referring MD: Shirline Frees, MD  ? ?Chief Complaint  ?Patient presents with  ? Congestive Heart Failure  ?   ?  ? ? ?History of Present Illness:   ? ?Jenna Stewart is a 71 y.o. female with a hx of CHF , hx of breast cancer,  ? ?Seen with husband, Jenna Stewart  ? ?Has had DOE for the past 4-5 months  ?Was very severe at first, now her symptoms have improved.  ?Not able to do any exercise currently  ?Was short of breath walking in from the parking lot .  ? ?Was eating more salt prior to her diagnosis ?Her LFTs are mildly elevated.  ?May be due to passive congestion .  ?Extreme fatigue  ? ?Addendum ?September 18, 2021 ? ?I received a copy of the transthoracic echo report which will be scanned in.  The findings are: ? ? severely depressed left ventricular systolic function with visual EF of 25 to 30% ?Unable to evaluate diastolic function ?Mild LAE, mild RAE, mild RV dilatation ?Mild aortic insufficiency ?Mild to moderate mitral regurgitation ?Mild tricuspid regurgitation ? ?Sep 22, 2021 ?Seen with son , Jenna Stewart  ?Breathing is better on the lasix ?But less energy than last week.  ?Appetite is still poor  ?HR is a little slower but not much  ? ?Since she has not made much progress I offered to admit her to the hospital for IV milrinone and consultation with the advanced heart failure team.  She would like to continue to try outpatient therapy for now. ? ?Plan :   ?Add Valsartan 40 mg a day  ?Basic metabolic profile today and again in 1 week.  She will see Christen Bame , NP in 1 week for further assessment.  If she is making progress then we we will hopefully be able to continue with outpatient therapy.  If she is not making progress or has a deterioration she will need to be admitted to the hospital and be seen by the heart failure  team. ? ?Past Medical History:  ?Diagnosis Date  ? Anxiety   ? Breast cancer ([place])   ? Cancer [facility])   ? History of colonic polyps   ? Hyperlipemia   ? Osteopenia   ? ? ?Past Surgical History:  ?Procedure Laterality Date  ? MASTECTOMY    ? ? ?Current Medications: ?Current Meds  ?Medication Sig  ? aspirin EC 81 MG tablet Take 81 mg by mouth daily. Swallow whole.  ? calcium carbonate 1250 MG capsule Take 1,250 mg by mouth 2 (two) times daily with a meal.  ? carvedilol (COREG) 3.125 MG tablet Take 1 tablet (3.125 mg total) by mouth 2 (two) times daily.  ? empagliflozin (JARDIANCE) 10 MG TABS tablet Take 1 tablet (10 mg total) by mouth daily before breakfast.  ? FLUoxetine (PROZAC) 20 MG capsule Take 1 capsule by mouth daily.  ? furosemide (LASIX) 40 MG tablet Take 1 tablet (40 mg total) by mouth daily.  ? Multiple Vitamin (MULTIVITAMIN) tablet Take 1 tablet by mouth daily.  ? niacin (NIASPAN) 750 MG CR tablet Take 750 mg by mouth at bedtime.  ? potassium chloride (KLOR-CON) 10 MEQ tablet Take 1 tablet (10 mEq total) by mouth 2 (two) times daily.  ? raloxifene (EVISTA) 60 MG tablet Take  60 mg by mouth daily.  ? valsartan (DIOVAN) 40 MG tablet Take 1 tablet (40 mg total) by mouth daily.  ? VITAMIN D, CHOLECALCIFEROL, PO Take 1 capsule by mouth daily.  ?  ? ?Allergies:   Patient has no known allergies.  ? ?Social History  ? ?Socioeconomic History  ? Marital status: Married  ?  Spouse name: Not on file  ? Number of children: Not on file  ? Years of education: Not on file  ? Highest education level: Not on file  ?Occupational History  ? Not on file  ?Tobacco Use  ? Smoking status: Never  ? Smokeless tobacco: Not on file  ?Substance and Sexual Activity  ? Alcohol use: Never  ? Drug use: Not on file  ? Sexual activity: Not on file  ?Other Topics Concern  ? Not on file  ?Social History Narrative  ? Not on file  ? ?Social Determinants of Health  ? ?Financial Resource Strain: Not on file  ?Food Insecurity: Not on file   ?Transportation Needs: Not on file  ?Physical Activity: Not on file  ?Stress: Not on file  ?Social Connections: Not on file  ?  ? ?Family History: ?The patient's family history includes Diabetes in her sister; Hypertension in her sister; Ulcerative colitis in her father; Uterine cancer in her maternal grandmother. ? ?ROS:   ?Please see the history of present illness.    ? All other systems reviewed and are negative. ? ?EKGs/Labs/Other Studies Reviewed:   ? ?The following studies were reviewed today: ? ? ?EKG:    ? ?Recent Labs: ?No results found for requested labs within last 8760 hours.  ?Recent Lipid Panel ?No results found for: CHOL, TRIG, HDL, CHOLHDL, VLDL, LDLCALC, LDLDIRECT ? ? ?Risk Assessment/Calculations:   ?  ? ?    ? ?Physical Exam:   ? ?Physical Exam: ?Blood pressure 104/66, pulse (!) 111, height '5\' 5"'$  (1.651 m), weight 141 lb 6.4 oz (64.1 kg), SpO2 98 %. ? ?GEN:  elderly female, , well developed in no acute distress ?HEENT: Normal ?NECK: No JVD; No carotid bruits ?LYMPHATICS: No lymphadenopathy ?CARDIAC: RRR, tachycardic.  She has a very soft systolic gallop which I think is less prominent than it was last week. ?RESPIRATORY: Basilar rales especially on the right side. ?ABDOMEN: Soft, non-tender, non-distended ?MUSCULOSKELETAL:  No edema; No deformity  ?SKIN: Warm and dry ?NEUROLOGIC:  Alert and oriented x 3 ?  ? ?ASSESSMENT:   ? ?1. Acute combined systolic and diastolic congestive heart failure ([place])   ?2. Medication management   ? ? ?PLAN:   ?  ? ?Acute combined CHF:   EF reportely 25%.  I am unable to see the echo that was performed at [facility].   Her S3 gallop sound better but is still present  ?On coreg 3.125 BID, Lasix to 40 mg a day , Kdur 10 BID  ?Jardiance 10 mg a day  ? ?Since she has not made much progress I offered to admit her to the hospital for IV milrinone and consultation with the advanced heart failure team.  She would like to continue to try outpatient therapy for now. ? ?Plan :   ?Add  Valsartan 40 mg a day  ?Basic metabolic profile today and again in 1 week.  She will see Christen Bame , NP in 1 week for further assessment.  If she is making progress then we we will hopefully be able to continue with outpatient therapy.  If she is not making  progress or has a deterioration she will need to be admitted to the hospital and be seen by the heart failure team. ?  ? ? ?   ? ?   ? ? ?Medication Adjustments/Labs and Tests Ordered: ?Current medicines are reviewed at length with the patient today.  Concerns regarding medicines are outlined above.  ?Orders Placed This Encounter  ?Procedures  ? Basic metabolic panel  ? ?Meds ordered this encounter  ?Medications  ? valsartan (DIOVAN) 40 MG tablet  ?  Sig: Take 1 tablet (40 mg total) by mouth daily.  ?  Dispense:  30 tablet  ?  Refill:  0  ? ? ? ?Patient Instructions  ?Medication Instructions:  ?START Valsartan '40mg'$  daily ?*If you need a refill on your cardiac medications before your next appointment, please call your pharmacy* ? ? ?Lab Work: ?BMET today ?If you have labs (blood work) drawn today and your tests are completely normal, you will receive your results only by: ?MyChart Message (if you have MyChart) OR ?A paper copy in the mail ?If you have any lab test that is abnormal or we need to change your treatment, we will call you to review the results. ? ? ?Testing/Procedures: ?NONE ? ? ?Follow-Up: ?At [facility], you and your health needs are our priority.  As part of our continuing mission to provide you with exceptional heart care, we have created designated Provider Care Teams.  These Care Teams include your primary Cardiologist (physician) and Advanced Practice Providers (APPs -  Physician Assistants and Nurse Practitioners) who all work together to provide you with the care you need, when you need it. ? ?We recommend signing up for the patient portal called "MyChart".  Sign up information is provided on this After Visit Summary.  MyChart is  used to connect with patients for Virtual Visits (Telemedicine).  Patients are able to view lab/test results, encounter notes, upcoming appointments, etc.  Non-urgent messages can be sent to your provider as well.   ?T

## 2021-09-22 NOTE — Patient Instructions (Signed)
Medication Instructions:  ?START Valsartan '40mg'$  daily ?*If you need a refill on your cardiac medications before your next appointment, please call your pharmacy* ? ? ?Lab Work: ?BMET today ?If you have labs (blood work) drawn today and your tests are completely normal, you will receive your results only by: ?MyChart Message (if you have MyChart) OR ?A paper copy in the mail ?If you have any lab test that is abnormal or we need to change your treatment, we will call you to review the results. ? ? ?Testing/Procedures: ?NONE ? ? ?Follow-Up: ?At [facility], you and your health needs are our priority.  As part of our continuing mission to provide you with exceptional heart care, we have created designated Provider Care Teams.  These Care Teams include your primary Cardiologist (physician) and Advanced Practice Providers (APPs -  Physician Assistants and Nurse Practitioners) who all work together to provide you with the care you need, when you need it. ? ?We recommend signing up for the patient portal called "MyChart".  Sign up information is provided on this After Visit Summary.  MyChart is used to connect with patients for Virtual Visits (Telemedicine).  Patients are able to view lab/test results, encounter notes, upcoming appointments, etc.  Non-urgent messages can be sent to your provider as well.   ?To learn more about what you can do with MyChart, go to [email or web address].   ? ?Your next appointment:   ?09/29/21 at 3:30pm ? ?The format for your next appointment:   ?In Person ? ?Provider:   ?Christen Bame, NP ? ? ?Important Information About Sugar ? ? ? ? ?  ?

## 2021-09-23 LAB — BASIC METABOLIC PANEL
BUN/Creatinine Ratio: 16 (ref 12–28)
BUN: 14 mg/dL (ref 8–27)
CO2: 21 mmol/L (ref 20–29)
Calcium: 8.8 mg/dL (ref 8.7–10.3)
Chloride: 102 mmol/L (ref 96–106)
Creatinine, Ser: 0.87 mg/dL (ref 0.57–1.00)
Glucose: 132 mg/dL — ABNORMAL HIGH (ref 70–99)
Potassium: 4.4 mmol/L (ref 3.5–5.2)
Sodium: 137 mmol/L (ref 134–144)
eGFR: 70 mL/min/{1.73_m2} (ref >59)

## 2021-09-26 NOTE — Progress Notes (Signed)
?Cardiology Office Note:   ? ?Date:  09/29/2021  ? ?ID:  Jenna Stewart, DOB 07-16-1950, MRN [id number] ? ?PCP:  Shirline Frees, MD ?  ?[facility] Providers ?Cardiologist:  Mertie Moores, MD ?Click to update primary MD,subspecialty MD or APP then REFRESH:1}   ? ?Referring MD: Shirline Frees, MD  ? ?Chief Complaint: follow-up acute on chronic combined CHF ? ?History of Present Illness:   ? ?Jenna Stewart is a very pleasant 71 y.o. 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   ? ?She was referred to our group by PCP, Dr. Kenton Kingfisher, and initially seen by Dr. Acie Fredrickson on 09/16/2021 for acute HFrEF.  She had been having DOE for a few months and had seen PCP for viral type illness - cough, congestion, SOB. CXR was concerning for CHF and he ordered an echo which revealed severely depressed LVEF 25-30%, unable to evaluate diastolic function, mild LAE, mild RAE, mild RV dilatation, mild AI, mild to moderate MR and, mild TR. She reported severe DOE with walking short distances. On exam she was very tachycardic with S3 gallop auscultated, + JVD. Started on Jardiance 10 mg, furosemide 40, carvedilol 3.125 bid, and potassium. She returned one week later on 09/22/21 and reported her breathing had improved, however she had less energy than the week before.  Appetite still poor, heart rate a little slower but not significant.  Dr. Acie Fredrickson discussed admitting her to the hospital for IV milrinone and consultation with AHF team.  She preferred to continue outpatient therapy if possible.  Valsartan 40 mg was added to her regimen and 1 week follow-up was scheduled. ? ?Today, she is here with her husband Jenna Stewart. She reports she is feeling better since last visit. Appetite has improved and she continues to experience weight loss.  She wakes up feeling somewhat energetic, however after taking morning medications she is very fatigued. HR has improved. Continues  to follow a low sodium diet. Home BP is soft, as low as 75 mmHg systolic. I encouraged her to increase her overall caloric intake with low sodium whole grain, fruits, vegetables, lean protein options. I am hoping this will increase her BP.  She denies chest pain, lower extremity edema, palpitations, melena, hematuria, hemoptysis, diaphoresis, weakness, presyncope, syncope, orthopnea, and PND. Is doing light housework and light yard work but also still feels like she needs to lie down frequently.  ? ?Past Medical History:  ?Diagnosis Date  ? Anxiety   ? Breast cancer ([place])   ? Cancer [facility])   ? History of colonic polyps   ? Hyperlipemia   ? Osteopenia   ? ? ?Past Surgical History:  ?Procedure Laterality Date  ? MASTECTOMY    ? ? ?Current Medications: ?Current Meds  ?Medication Sig  ? aspirin EC 81 MG tablet Take 81 mg by mouth daily. Swallow whole.  ? calcium carbonate 1250 MG capsule Take 1,250 mg by mouth 2 (two) times daily with a meal.  ? carvedilol (COREG) 3.125 MG tablet Take 1 tablet (3.125 mg total) by mouth 2 (two) times daily.  ? empagliflozin (JARDIANCE) 10 MG TABS tablet Take 1 tablet (10 mg total) by mouth daily before breakfast.  ? FLUoxetine (PROZAC) 20 MG capsule Take 1 capsule by mouth daily.  ? Multiple Vitamin (MULTIVITAMIN) tablet Take 1 tablet by mouth daily.  ? niacin (NIASPAN) 750 MG CR tablet Take 750 mg by mouth at bedtime.  ? potassium chloride (KLOR-CON)  10 MEQ tablet Take 1 tablet (10 mEq total) by mouth 2 (two) times daily.  ? raloxifene (EVISTA) 60 MG tablet Take 60 mg by mouth daily.  ? valsartan (DIOVAN) 40 MG tablet Take 1 tablet (40 mg total) by mouth daily.  ? VITAMIN D, CHOLECALCIFEROL, PO Take 1 capsule by mouth daily.  ? [DISCONTINUED] furosemide (LASIX) 40 MG tablet Take 1 tablet (40 mg total) by mouth daily.  ?  ? ?Allergies:   Patient has no known allergies.  ? ?Social History  ? ?Socioeconomic History  ? Marital status: Married  ?  Spouse name: Not on file  ? Number of  children: Not on file  ? Years of education: Not on file  ? Highest education level: Not on file  ?Occupational History  ? Not on file  ?Tobacco Use  ? Smoking status: Never  ? Smokeless tobacco: Not on file  ?Substance and Sexual Activity  ? Alcohol use: Never  ? Drug use: Not on file  ? Sexual activity: Not on file  ?Other Topics Concern  ? Not on file  ?Social History Narrative  ? Not on file  ? ?Social Determinants of Health  ? ?Financial Resource Strain: Not on file  ?Food Insecurity: Not on file  ?Transportation Needs: Not on file  ?Physical Activity: Not on file  ?Stress: Not on file  ?Social Connections: Not on file  ?  ? ?Family History: ?The patient's family history includes Diabetes in her sister; Hypertension in her sister; Ulcerative colitis in her father; Uterine cancer in her maternal grandmother. ? ?ROS:   ?Please see the history of present illness.    ?+ dyspnea on exertion ?+ fatigue ?All other systems reviewed and are negative. ? ?Labs/Other Studies Reviewed:   ? ?The following studies were reviewed today: ? ?Echo 08/2021 ? ?severely depressed left ventricular systolic function with visual EF of 25 to 30% ?Unable to evaluate diastolic function ?Mild LAE, mild RAE, mild RV dilatation ?Mild aortic insufficiency ?Mild to moderate mitral regurgitation ?Mild tricuspid regurgitation  ? ?CXR 08/23/21 ? ?IMPRESSION: ?1. Cardiomegaly with central pulmonary vascular congestion ?2. Bibasilar infiltrates may represent edema and or infection. ?3. Small pleural effusions. ?  ?  ?Recent Labs: ?09/22/2021: BUN 14; Creatinine, Ser 0.87; Potassium 4.4; Sodium 137  ?Recent Lipid Panel ?No results found for: CHOL, TRIG, HDL, CHOLHDL, VLDL, LDLCALC, LDLDIRECT ? ? ?Risk Assessment/Calculations:   ?  ?  ? ?Physical Exam:   ? ?VS:  BP (!) 93/53   Pulse 99   Ht '5\' 5"'$  (1.651 m)   Wt 141 lb 3.2 oz (64 kg)   SpO2 97%   BMI 23.50 kg/m?    ? ?Wt Readings from Last 3 Encounters:  ?09/29/21 141 lb 3.2 oz (64 kg)  ?09/22/21 141  lb 6.4 oz (64.1 kg)  ?09/16/21 143 lb 12.8 oz (65.2 kg)  ?  ? ?GEN:  Well nourished, well developed in no acute distress ?HEENT: Normal ?NECK: No JVD; No carotid bruits ?CARDIAC: RRR, no murmurs, rubs, No S3 gallop  ?RESPIRATORY:  Clear to auscultation without rales, wheezing or rhonchi  ?ABDOMEN: Soft, non-tender, non-distended ?MUSCULOSKELETAL:  No edema; No deformity. 2+ pedal pulses, equal bilaterally ?SKIN: Warm and dry ?NEUROLOGIC:  Alert and oriented x 3 ?PSYCHIATRIC:  Normal affect  ? ?EKG:  EKG is ordered today.  The ekg ordered today demonstrates NSR at 99 bpm, voltage for LVH, no ST/T wave abnormality ? ?Diagnoses:   ? ?1. Acute combined systolic and diastolic congestive  heart failure ([place])   ?2. Tachycardia, unspecified   ?3. Other fatigue   ?4. Medication management   ? ?Assessment and Plan:   ? ? ?Acute on chronic combined CHF: Severely reduced LVEF 25%, LV dilated, unable to evaluate diastolic function, RV slightly dilated, normal RV function. Mild valvular disease on echo 09/11/21. Feeling better today. On GDMT including Jardiance, valsartan, carvedilol, Lasix. We will reduce Lasix to 20 mg a day to provide some room in her BP to start Entresto at next office visit. Advised her to notify us if she notices an increase in edema, SOB, or weight gain on the reduced dose. Will get BMET today. Will see her back in 2 weeks for continued medication titration.  ? ?Fatigue: BP is soft.  She is very fatigued after taking her morning medications. I suspect a significant decrease in BP with all of her medications being taken in the morning. I advised her to change valsartan to evening dosing. Advised her to reserve lying down for a daytime nap for times of severe fatigue and to otherwise sit and rest and read a book or do a puzzle between activities. Encouraged some intermittent physical activity such as a pedal exerciser or laps inside the house.  ? ?Tachycardia: Improved with HR 99 bpm today. Continue  carvedilol.  ?   ?Disposition: 2 weeks with APP  ? ?Medication Adjustments/Labs and Tests Ordered: ?Current medicines are reviewed at length with the patient today.  Concerns regarding medicines are outlined above.  ?Order

## 2021-09-29 ENCOUNTER — Encounter: Payer: Self-pay | Admitting: Nurse Practitioner

## 2021-09-29 ENCOUNTER — Ambulatory Visit: Payer: Medicare PPO | Admitting: Nurse Practitioner

## 2021-09-29 VITALS — BP 93/53 | HR 99 | Ht 65.0 in | Wt 141.2 lb

## 2021-09-29 DIAGNOSIS — R000 Tachycardia, unspecified: Secondary | ICD-10-CM

## 2021-09-29 DIAGNOSIS — I5041 Acute combined systolic (congestive) and diastolic (congestive) heart failure: Secondary | ICD-10-CM

## 2021-09-29 DIAGNOSIS — R5383 Other fatigue: Secondary | ICD-10-CM

## 2021-09-29 DIAGNOSIS — Z79899 Other long term (current) drug therapy: Secondary | ICD-10-CM | POA: Diagnosis not present

## 2021-09-29 MED ORDER — FUROSEMIDE 40 MG PO TABS: 20.0000 mg | ORAL_TABLET | Freq: Every day | ORAL | 3 refills | Status: DC

## 2021-09-29 NOTE — Patient Instructions (Signed)
Medication Instructions:  ? ?DECREASE Lasix one half tablet by mouth (20 mg) daily.  ? ?*If you need a refill on your cardiac medications before your next appointment, please call your pharmacy* ? ? ?Lab Work: ? ?TODAY!!!!  BMET ? ?If you have labs (blood work) drawn today and your tests are completely normal, you will receive your results only by: ?MyChart Message (if you have MyChart) OR ?A paper copy in the mail ?If you have any lab test that is abnormal or we need to change your treatment, we will call you to review the results. ? ? ?Testing/Procedures: ? ?None ordered. ? ? ?Follow-Up: ?At [facility], you and your health needs are our priority.  As part of our continuing mission to provide you with exceptional heart care, we have created designated Provider Care Teams.  These Care Teams include your primary Cardiologist (physician) and Advanced Practice Providers (APPs -  Physician Assistants and Nurse Practitioners) who all work together to provide you with the care you need, when you need it. ? ?We recommend signing up for the patient portal called "MyChart".  Sign up information is provided on this After Visit Summary.  MyChart is used to connect with patients for Virtual Visits (Telemedicine).  Patients are able to view lab/test results, encounter notes, upcoming appointments, etc.  Non-urgent messages can be sent to your provider as well.   ?To learn more about what you can do with MyChart, go to [email or web address].   ? ?Your next appointment:   ?2 week(s) ? ?The format for your next appointment:   ?In Person ? ?Provider:   ?Christen Bame, NP       ? ?Important Information About Sugar ? ? ? ? ?  ?

## 2021-09-30 LAB — BASIC METABOLIC PANEL
BUN/Creatinine Ratio: 19 (ref 12–28)
BUN: 17 mg/dL (ref 8–27)
CO2: 24 mmol/L (ref 20–29)
Calcium: 9.2 mg/dL (ref 8.7–10.3)
Chloride: 102 mmol/L (ref 96–106)
Creatinine, Ser: 0.9 mg/dL (ref 0.57–1.00)
Glucose: 79 mg/dL (ref 70–99)
Potassium: 4.7 mmol/L (ref 3.5–5.2)
Sodium: 141 mmol/L (ref 134–144)
eGFR: 68 mL/min/{1.73_m2} (ref >59)

## 2021-10-09 NOTE — Progress Notes (Signed)
Cardiology Office Note:    Date:  10/15/2021   ID:  Jenna, Stewart 19-Dec-1950, MRN [id number]  PCP:  Jenna Frees, MD   [facility] Providers Cardiologist:  Jenna Moores, MD Click to update primary MD,subspecialty MD or APP then REFRESH:1}    Referring MD: Jenna Frees, MD   Chief Complaint: follow-up acute on chronic combined CHF  History of Present Illness:    Jenna Stewart is a very pleasant 71 y.o. 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    She was referred to our group by PCP, Dr. Kenton Stewart, and initially seen by Dr. Acie Stewart on 09/16/2021 for acute HFrEF.  She had been having DOE for a few months and had seen PCP for viral type illness - cough, congestion, SOB. CXR was concerning for CHF and he ordered an echo which revealed severely depressed LVEF 25-30%, unable to evaluate diastolic function, mild LAE, mild RAE, mild RV dilatation, mild AI, mild to moderate MR and, mild TR. She reported severe DOE with walking short distances. On exam she was very tachycardic with S3 gallop auscultated, + JVD. Started on Jardiance 10 mg, furosemide 40, carvedilol 3.125 bid, and potassium. She returned one week later on 09/22/21 and reported her breathing had improved, however she had less energy than the week before.  Appetite still poor, heart rate a little slower but not significant.  Dr. Acie Stewart discussed admitting her to the hospital for IV milrinone and consultation with AHF team.  She preferred to continue outpatient therapy if possible.  Valsartan 40 mg was added to her regimen and 1 week follow-up was scheduled.  She was last seen in our office by me on 09/29/2021 . Reported feeling better since last visit. Appetite improved and continuing to experience weight loss.  She wakes up feeling somewhat energetic, however after taking morning medications she is very fatigued. HR improved. Home BP soft, as  low as 75 mmHg systolic. I encouraged her to increase her overall caloric intake with low sodium whole grain, fruits, vegetables, lean protein options. I am hoping this will increase her BP. Encouraged her to attempt to increase activity, resting as needed throughout the day.   Today, she is here with her husband Jenna Stewart and reports she is feeling well. No significant SOB. She has been more active doing yard work and other household chores and continues to rest intermittently throughout the day. Overall she feels that her fatigue has improved with the exception of about 2 hours after she takes her medication when she feels wiped out.  She is sleeping well without orthopnea or PND. No chest pain.  BP is soft today as well as on readings provided from home.  She denies lightheadedness, presyncope, syncope.  Continues to struggle with poor appetite.  Admits she is likely consuming less than 1000 cal/day.  Past Medical History:  Diagnosis Date   Anxiety    Breast cancer ([place])    Cancer ([place])    History of colonic polyps    Hyperlipemia    Osteopenia     Past Surgical History:  Procedure Laterality Date   MASTECTOMY      Current Medications: Current Meds  Medication Sig   aspirin EC 81 MG tablet Take 81 mg by mouth daily. Swallow whole.   calcium carbonate 1250 MG capsule Take 1,250 mg by mouth 2 (two) times daily with a meal.   carvedilol (COREG) 3.125 MG tablet  Take 1 tablet (3.125 mg total) by mouth 2 (two) times daily.   empagliflozin (JARDIANCE) 10 MG TABS tablet Take 1 tablet (10 mg total) by mouth daily before breakfast.   FLUoxetine (PROZAC) 20 MG capsule Take 1 capsule by mouth daily.   furosemide (LASIX) 40 MG tablet Take 0.5 tablets (20 mg total) by mouth daily.   Multiple Vitamin (MULTIVITAMIN) tablet Take 1 tablet by mouth daily.   niacin (NIASPAN) 750 MG CR tablet Take 750 mg by mouth at bedtime.   potassium chloride (KLOR-CON) 10 MEQ tablet Take 1 tablet (10 mEq total) by mouth 2  (two) times daily.   raloxifene (EVISTA) 60 MG tablet Take 60 mg by mouth daily.   valsartan (DIOVAN) 40 MG tablet Take 1 tablet (40 mg total) by mouth daily.   VITAMIN D, CHOLECALCIFEROL, PO Take 1 capsule by mouth daily.     Allergies:   Patient has no known allergies.   Social History   Socioeconomic History   Marital status: Married    Spouse name: Not on file   Number of children: Not on file   Years of education: Not on file   Highest education level: Not on file  Occupational History   Not on file  Tobacco Use   Smoking status: Never   Smokeless tobacco: Not on file  Substance and Sexual Activity   Alcohol use: Never   Drug use: Not on file   Sexual activity: Not on file  Other Topics Concern   Not on file  Social History Narrative   Not on file   Social Determinants of Health   Financial Resource Strain: Not on file  Food Insecurity: Not on file  Transportation Needs: Not on file  Physical Activity: Not on file  Stress: Not on file  Social Connections: Not on file     Family History: The patient's family history includes Diabetes in her sister; Hypertension in her sister; Ulcerative colitis in her father; Uterine cancer in her maternal grandmother.  ROS:   Please see the history of present illness.    ** All other systems reviewed and are negative.  Labs/Other Studies Reviewed:    The following studies were reviewed today:  Echo 08/2021  severely depressed left ventricular systolic function with visual EF of 25 to 30% Unable to evaluate diastolic function Mild LAE, mild RAE, mild RV dilatation Mild aortic insufficiency Mild to moderate mitral regurgitation Mild tricuspid regurgitation   CXR 08/23/21  IMPRESSION: 1. Cardiomegaly with central pulmonary vascular congestion 2. Bibasilar infiltrates may represent edema and or infection. 3. Small pleural effusions.     Recent Labs: 09/29/2021: BUN 17; Creatinine, Ser 0.90; Potassium 4.7; Sodium 141   Recent Lipid Panel No results found for: CHOL, TRIG, HDL, CHOLHDL, VLDL, LDLCALC, LDLDIRECT   Risk Assessment/Calculations:        Physical Exam:    VS:  BP (!) 98/56   Pulse 98   Ht '5\' 5"'$  (1.651 m)   Wt 137 lb (62.1 kg)   SpO2 93%   BMI 22.80 kg/m     Wt Readings from Last 3 Encounters:  10/15/21 137 lb (62.1 kg)  09/29/21 141 lb 3.2 oz (64 kg)  09/22/21 141 lb 6.4 oz (64.1 kg)     GEN:  Well nourished, well developed in no acute distress HEENT: Normal NECK: No JVD; No carotid bruits CARDIAC: RRR, no murmurs, rubs, No S3 gallop  RESPIRATORY:  Clear to auscultation without rales, wheezing or rhonchi  ABDOMEN:  Soft, non-tender, non-distended MUSCULOSKELETAL:  No edema; No deformity. 2+ pedal pulses, equal bilaterally SKIN: Warm and dry NEUROLOGIC:  Alert and oriented x 3 PSYCHIATRIC:  Normal affect   EKG:  EKG is ordered today.  The ekg ordered today demonstrates NSR at 99 bpm, voltage for LVH, no ST/T wave abnormality  Diagnoses:    No diagnosis found.  Assessment and Plan:     Acute on chronic combined CHF: Severely reduced LVEF 25% by echo 09/11/21. She feels markedly better than when initially seen a few weeks ago. On GDMT including Jardiance, valsartan, carvedilol, Lasix.  No concerns for dyspnea, orthopnea, edema, or PND since reducing Lasix to 20 mg a day.  Unfortunately this did not give [place] enough room in her BP to add Entresto.  Encouraged her to improve nutrition. We will see her back in 4 weeks.  Fatigue: BP is soft. Continues to note significant fatigue about 2 hours after morning medications.  Slight improvement since she started taking valsartan in the evening. Home BP ranges from 70s to low 244W systolic. As noted below, we had a long discussion about the importance of nutrition. Encouraged  continued intermittent physical activity such as a pedal exerciser and yard work which she really enjoys.   Tachycardia: HR 98 bpm today. Home pulse readings range  from 83-110. Cannot increase carvedilol in the setting of soft BP. Could consider metoprolol succinate in the future.   Anorexia: Continues to struggle with poor appetite. Unfortunately, neither she or her husband like to cook.  We had a long discussion about the importance of consuming enough calories to give her strength to remain active. Encouraged healthy choices that are also rich in calories. Mediterranean diet provided.     Disposition: 4 weeks with Dr. Acie Stewart or Christen Bame, NP  Medication Adjustments/Labs and Tests Ordered: Current medicines are reviewed at length with the patient today.  Concerns regarding medicines are outlined above.  No orders of the defined types were placed in this encounter.  No orders of the defined types were placed in this encounter.   There are no Patient Instructions on file for this visit.   Signed, Emmaline Life, NP  10/15/2021 2:07 PM    [facility]

## 2021-10-15 ENCOUNTER — Encounter: Payer: Self-pay | Admitting: Nurse Practitioner

## 2021-10-15 ENCOUNTER — Ambulatory Visit: Payer: Medicare PPO | Admitting: Nurse Practitioner

## 2021-10-15 VITALS — BP 98/56 | HR 98 | Ht 65.0 in | Wt 137.0 lb

## 2021-10-15 DIAGNOSIS — R630 Anorexia: Secondary | ICD-10-CM | POA: Diagnosis not present

## 2021-10-15 DIAGNOSIS — R5383 Other fatigue: Secondary | ICD-10-CM

## 2021-10-15 DIAGNOSIS — I5041 Acute combined systolic (congestive) and diastolic (congestive) heart failure: Secondary | ICD-10-CM | POA: Diagnosis not present

## 2021-10-15 DIAGNOSIS — R000 Tachycardia, unspecified: Secondary | ICD-10-CM

## 2021-10-15 MED ORDER — VALSARTAN 40 MG PO TABS: 40.0000 mg | ORAL_TABLET | Freq: Every day | ORAL | 3 refills | Status: DC

## 2021-10-15 NOTE — Patient Instructions (Signed)
Medication Instructions:   Your physician recommends that you continue on your current medications as directed. Please refer to the Current Medication list given to you today.  *If you need a refill on your cardiac medications before your next appointment, please call your pharmacy*   Lab Work:  None ordered  If you have labs (blood work) drawn today and your tests are completely normal, you will receive your results only by: [place] (if you have MyChart) OR A paper copy in the mail If you have any lab test that is abnormal or we need to change your treatment, we will call you to review the results.   Testing/Procedures:  None ordered   Follow-Up: At [facility], you and your health needs are our priority.  As part of our continuing mission to provide you with exceptional heart care, we have created designated Provider Care Teams.  These Care Teams include your primary Cardiologist (physician) and Advanced Practice Providers (APPs -  Physician Assistants and Nurse Practitioners) who all work together to provide you with the care you need, when you need it.  We recommend signing up for the patient portal called "MyChart".  Sign up information is provided on this After Visit Summary.  MyChart is used to connect with patients for Virtual Visits (Telemedicine).  Patients are able to view lab/test results, encounter notes, upcoming appointments, etc.  Non-urgent messages can be sent to your provider as well.   To learn more about what you can do with MyChart, go to [email or web address].    Your next appointment:   4 week(s)  The format for your next appointment:   In Person  Provider:   Mertie Moores, MD     Other Instructions Mediterranean Diet A Mediterranean diet refers to food and lifestyle choices that are based on the traditions of countries located on the [place]. It focuses on eating more fruits, vegetables, whole grains, beans, nuts, seeds, and  heart-healthy fats, and eating less dairy, meat, eggs, and processed foods with added sugar, salt, and fat. This way of eating has been shown to help prevent certain conditions and improve outcomes for people who have chronic diseases, like kidney disease and heart disease. What are tips for following this plan? Reading food labels Check the serving size of packaged foods. For foods such as rice and pasta, the serving size refers to the amount of cooked product, not dry. Check the total fat in packaged foods. Avoid foods that have saturated fat or trans fats. Check the ingredient list for added sugars, such as corn syrup. Shopping  Buy a variety of foods that offer a balanced diet, including: Fresh fruits and vegetables (produce). Grains, beans, nuts, and seeds. Some of these may be available in unpackaged forms or large amounts (in bulk). Fresh seafood. Poultry and eggs. Low-fat dairy products. Buy whole ingredients instead of prepackaged foods. Buy fresh fruits and vegetables in-season from local farmers markets. Buy plain frozen fruits and vegetables. If you do not have access to quality fresh seafood, buy precooked frozen shrimp or canned fish, such as tuna, salmon, or sardines. Stock your pantry so you always have certain foods on hand, such as olive oil, canned tuna, canned tomatoes, rice, pasta, and beans. Cooking Cook foods with extra-virgin olive oil instead of using butter or other vegetable oils. Have meat as a side dish, and have vegetables or grains as your main dish. This means having meat in small portions or adding small amounts of meat to foods  like pasta or stew. Use beans or vegetables instead of meat in common dishes like chili or lasagna. Experiment with different cooking methods. Try roasting, broiling, steaming, and sauting vegetables. Add frozen vegetables to soups, stews, pasta, or rice. Add nuts or seeds for added healthy fats and plant protein at each meal. You  can add these to yogurt, salads, or vegetable dishes. Marinate fish or vegetables using olive oil, lemon juice, garlic, and fresh herbs. Meal planning Plan to eat one vegetarian meal one day each week. Try to work up to two vegetarian meals, if possible. Eat seafood two or more times a week. Have healthy snacks readily available, such as: Vegetable sticks with hummus. Greek yogurt. Fruit and nut trail mix. Eat balanced meals throughout the week. This includes: Fruit: 2-3 servings a day. Vegetables: 4-5 servings a day. Low-fat dairy: 2 servings a day. Fish, poultry, or lean meat: 1 serving a day. Beans and legumes: 2 or more servings a week. Nuts and seeds: 1-2 servings a day. Whole grains: 6-8 servings a day. Extra-virgin olive oil: 3-4 servings a day. Limit red meat and sweets to only a few servings a month. Lifestyle  Cook and eat meals together with your family, when possible. Drink enough fluid to keep your urine pale yellow. Be physically active every day. This includes: Aerobic exercise like running or swimming. Leisure activities like gardening, walking, or housework. Get 7-8 hours of sleep each night. If recommended by your health care provider, drink red wine in moderation. This means 1 glass a day for nonpregnant women and 2 glasses a day for men. A glass of wine equals 5 oz (150 mL). What foods should I eat? Fruits Apples. Apricots. Avocado. Berries. Bananas. Cherries. Dates. Figs. Grapes. Lemons. Melon. Oranges. Peaches. Plums. Pomegranate. Vegetables Artichokes. Beets. Broccoli. Cabbage. Carrots. Eggplant. Green beans. Chard. Kale. Spinach. Onions. Leeks. Peas. Squash. Tomatoes. Peppers. Radishes. Grains Whole-grain pasta. Brown rice. Bulgur wheat. Polenta. Couscous. Whole-wheat bread. [place]. Meats and other proteins Beans. Almonds. Sunflower seeds. Pine nuts. Peanuts. [place]. Salmon. Scallops. Shrimp. [place]. Tilapia. Clams. Oysters. Eggs. Poultry without  skin. Dairy Low-fat milk. Cheese. Greek yogurt. Fats and oils Extra-virgin olive oil. Avocado oil. Grapeseed oil. Beverages Water. Red wine. Herbal tea. Sweets and desserts Greek yogurt with honey. Baked apples. Poached pears. Trail mix. Seasonings and condiments Basil. Cilantro. Coriander. Cumin. Mint. Parsley. Sage. Rosemary. Tarragon. Garlic. Oregano. Thyme. Pepper. Balsamic vinegar. Tahini. Hummus. Tomato sauce. Olives. Mushrooms. The items listed above may not be a complete list of foods and beverages you can eat. Contact a dietitian for more information. What foods should I limit? This is a list of foods that should be eaten rarely or only on special occasions. Fruits Fruit canned in syrup. Vegetables Deep-fried potatoes (french fries). Grains Prepackaged pasta or rice dishes. Prepackaged cereal with added sugar. Prepackaged snacks with added sugar. Meats and other proteins Beef. Pork. Lamb. Poultry with skin. Hot dogs. Bernie[place] Salines. Dairy Ice cream. Sour cream. Whole milk. Fats and oils Butter. Canola oil. Vegetable oil. Beef fat (tallow). Lard. Beverages Juice. Sugar-sweetened soft drinks. Beer. Liquor and spirits. Sweets and desserts Cookies. Cakes. Pies. Candy. Seasonings and condiments Mayonnaise. Pre-made sauces and marinades. The items listed above may not be a complete list of foods and beverages you should limit. Contact a dietitian for more information. Summary The Mediterranean diet includes both food and lifestyle choices. Eat a variety of fresh fruits and vegetables, beans, nuts, seeds, and whole grains. Limit the amount of red meat and  sweets that you eat. If recommended by your health care provider, drink red wine in moderation. This means 1 glass a day for nonpregnant women and 2 glasses a day for men. A glass of wine equals 5 oz (150 mL). This information is not intended to replace advice given to you by your health care provider. Make sure you discuss any  questions you have with your health care provider. Document Revised: 06/15/2019 Document Reviewed: 04/12/2019 Elsevier Patient Education  [place]

## 2021-11-15 ENCOUNTER — Encounter: Payer: Self-pay | Admitting: Cardiovascular Disease

## 2021-11-16 ENCOUNTER — Encounter: Payer: Self-pay | Admitting: Cardiovascular Disease

## 2021-11-16 ENCOUNTER — Ambulatory Visit: Payer: Medicare PPO | Admitting: Cardiovascular Disease

## 2021-11-16 VITALS — BP 111/67 | HR 105 | Ht 65.0 in | Wt 137.0 lb

## 2021-11-16 DIAGNOSIS — R008 Other abnormalities of heart beat: Secondary | ICD-10-CM | POA: Diagnosis not present

## 2021-11-16 DIAGNOSIS — I5041 Acute combined systolic (congestive) and diastolic (congestive) heart failure: Secondary | ICD-10-CM | POA: Diagnosis not present

## 2021-11-16 DIAGNOSIS — R000 Tachycardia, unspecified: Secondary | ICD-10-CM | POA: Diagnosis not present

## 2021-11-16 MED ORDER — CARVEDILOL 6.25 MG PO TABS: 6.2500 mg | ORAL_TABLET | Freq: Two times a day (BID) | ORAL | 3 refills | Status: DC

## 2021-11-16 MED ORDER — SPIRONOLACTONE 25 MG PO TABS: 25.0000 mg | ORAL_TABLET | Freq: Every day | ORAL | 3 refills | Status: DC

## 2021-11-18 ENCOUNTER — Telehealth: Payer: Self-pay | Admitting: Nurse Practitioner

## 2021-11-18 MED ORDER — CARVEDILOL 3.125 MG PO TABS: 3.1250 mg | ORAL_TABLET | Freq: Two times a day (BID) | ORAL | 3 refills | Status: DC

## 2021-11-18 NOTE — Telephone Encounter (Signed)
Discussed current plan of treatment with Dr. Acie Fredrickson in regards to recent lecture on management of heart failure by Dr. Haroldine Laws, director of [facility]. Dr. Acie Fredrickson is in agreement that we have Jenna Stewart start spironolactone 25 mg daily and continue carvedilol at 3.125 mg daily rather than increase carvedilol to 6.25 mg as previously discussed at office visit on 6/26. She is already on Jardiance, and valsartan.  I left a message for Jenna Stewart to call back to discuss changes.

## 2021-11-18 NOTE — Telephone Encounter (Signed)
Reviewed medication changes with patient and sent her a MyChart message listing the changes. She verbalized understanding and agreement and thanked me for the call.

## 2021-11-26 ENCOUNTER — Other Ambulatory Visit: Payer: Medicare PPO | Admitting: *Deleted

## 2021-11-26 ENCOUNTER — Ambulatory Visit (HOSPITAL_COMMUNITY): Payer: Medicare PPO | Attending: Cardiology

## 2021-11-26 DIAGNOSIS — R000 Tachycardia, unspecified: Secondary | ICD-10-CM | POA: Insufficient documentation

## 2021-11-26 DIAGNOSIS — I5041 Acute combined systolic (congestive) and diastolic (congestive) heart failure: Secondary | ICD-10-CM

## 2021-11-26 DIAGNOSIS — R008 Other abnormalities of heart beat: Secondary | ICD-10-CM | POA: Diagnosis not present

## 2021-11-26 LAB — ECHOCARDIOGRAM COMPLETE
MV M vel: 4.35 m/s
MV Peak grad: 75.7 mmHg
S' Lateral: 5.5 cm

## 2021-11-26 MED ORDER — PERFLUTREN LIPID MICROSPHERE
1.0000 mL | INTRAVENOUS | Status: AC | PRN
  Administered 2021-11-26: 1 mL via INTRAVENOUS

## 2021-11-27 ENCOUNTER — Telehealth: Payer: Self-pay

## 2021-11-27 LAB — BASIC METABOLIC PANEL
BUN/Creatinine Ratio: 26 (ref 12–28)
BUN: 22 mg/dL (ref 8–27)
CO2: 21 mmol/L (ref 20–29)
Calcium: 9.7 mg/dL (ref 8.7–10.3)
Chloride: 103 mmol/L (ref 96–106)
Creatinine, Ser: 0.86 mg/dL (ref 0.57–1.00)
Glucose: 99 mg/dL (ref 70–99)
Potassium: 5.1 mmol/L (ref 3.5–5.2)
Sodium: 139 mmol/L (ref 134–144)
eGFR: 71 mL/min/{1.73_m2} (ref >59)

## 2021-11-27 MED ORDER — ENTRESTO 24-26 MG PO TABS: 1.0000 | ORAL_TABLET | Freq: Two times a day (BID) | ORAL | 11 refills | Status: DC

## 2021-11-27 NOTE — Telephone Encounter (Signed)
-----   Message from Thayer Headings, MD sent at 11/27/2021  2:38 PM EDT ----- LV function remains low and unchanged DC coreg. DC valsartan   Start Entresto 24-26 mg BID   I have called and talked to her about the changes.   Please make sure she knows to stop the coreg and the valsartan when she starts the [facility]   I have put in a referral for her to see the CHF team .

## 2021-11-27 NOTE — Telephone Encounter (Signed)
Called and spoke with patient who understands to stop Carvedilol and Valsartan when she begins taking Entresto. Medication sent to pharmacy on file.

## 2021-12-03 ENCOUNTER — Telehealth: Payer: Self-pay | Admitting: Nurse Practitioner

## 2021-12-03 NOTE — Telephone Encounter (Signed)
Jenna Stewart contacted me and reported low BP readings since starting Entresto. HR was elevated generally ranging from 90-120s. She held 3 doses of Entresto along with spironolactone and BP improved. HR remained somewhat elevated. She remained on Jardiance. I discussed with Dr. Acie Fredrickson and we advised her to continue to hold [facility] for now, resume valsartan 40 mg and spironolactone 12.5 mg on 12/02/21 and to continue  to monitor BP and HR and notify us with concerns. Her weight has remained stable with no signs of fluid overload and she denied severe lightheadedness or presyncope, syncope. She is scheduled to see Dr. Haroldine Laws on 7/19.

## 2021-12-08 ENCOUNTER — Ambulatory Visit: Payer: Medicare PPO | Admitting: Cardiovascular Disease

## 2021-12-09 ENCOUNTER — Other Ambulatory Visit (HOSPITAL_COMMUNITY): Payer: Self-pay

## 2021-12-09 ENCOUNTER — Encounter (HOSPITAL_COMMUNITY): Payer: Self-pay | Admitting: Internal Medicine

## 2021-12-09 ENCOUNTER — Ambulatory Visit (HOSPITAL_COMMUNITY)
Admission: RE | Admit: 2021-12-09 | Discharge: 2021-12-09 | Disposition: A | Discharge status: Home / Self Care | Payer: Medicare PPO | Source: Ambulatory Visit | Attending: Internal Medicine | Admitting: Internal Medicine

## 2021-12-09 VITALS — BP 102/60 | HR 101 | Wt 136.8 lb

## 2021-12-09 DIAGNOSIS — I959 Hypotension, unspecified: Secondary | ICD-10-CM | POA: Insufficient documentation

## 2021-12-09 DIAGNOSIS — Z79899 Other long term (current) drug therapy: Secondary | ICD-10-CM | POA: Insufficient documentation

## 2021-12-09 DIAGNOSIS — I110 Hypertensive heart disease with heart failure: Secondary | ICD-10-CM | POA: Insufficient documentation

## 2021-12-09 DIAGNOSIS — R000 Tachycardia, unspecified: Secondary | ICD-10-CM

## 2021-12-09 DIAGNOSIS — Z9012 Acquired absence of left breast and nipple: Secondary | ICD-10-CM | POA: Insufficient documentation

## 2021-12-09 DIAGNOSIS — Z853 Personal history of malignant neoplasm of breast: Secondary | ICD-10-CM | POA: Insufficient documentation

## 2021-12-09 DIAGNOSIS — C50919 Malignant neoplasm of unspecified site of unspecified female breast: Secondary | ICD-10-CM | POA: Insufficient documentation

## 2021-12-09 DIAGNOSIS — I5022 Chronic systolic (congestive) heart failure: Secondary | ICD-10-CM

## 2021-12-09 DIAGNOSIS — Z7984 Long term (current) use of oral hypoglycemic drugs: Secondary | ICD-10-CM | POA: Insufficient documentation

## 2021-12-09 LAB — CBC
HCT: 44.4 % (ref 36.0–46.0)
Hemoglobin: 14.4 g/dL (ref 12.0–15.0)
MCH: 28.3 pg (ref 26.0–34.0)
MCHC: 32.4 g/dL (ref 30.0–36.0)
MCV: 87.4 fL (ref 80.0–100.0)
Platelets: 304 10*3/uL (ref 150–400)
RBC: 5.08 MIL/uL (ref 3.87–5.11)
RDW: 19.2 % — ABNORMAL HIGH (ref 11.5–15.5)
WBC: 5.9 10*3/uL (ref 4.0–10.5)
nRBC: 0 % (ref 0.0–0.2)

## 2021-12-09 LAB — BASIC METABOLIC PANEL
Anion gap: 10 (ref 5–15)
BUN: 20 mg/dL (ref 8–23)
CO2: 21 mmol/L — ABNORMAL LOW (ref 22–32)
Calcium: 9.7 mg/dL (ref 8.9–10.3)
Chloride: 107 mmol/L (ref 98–111)
Creatinine, Ser: 0.68 mg/dL (ref 0.44–1.00)
GFR, Estimated: >60 mL/min (ref >60)
Glucose, Bld: 105 mg/dL — ABNORMAL HIGH (ref 70–99)
Potassium: 4.2 mmol/L (ref 3.5–5.1)
Sodium: 138 mmol/L (ref 135–145)

## 2021-12-09 LAB — BRAIN NATRIURETIC PEPTIDE: B Natriuretic Peptide: 543 pg/mL — ABNORMAL HIGH (ref 0.0–100.0)

## 2021-12-09 MED ORDER — DIGOXIN 125 MCG PO TABS: 0.1250 mg | ORAL_TABLET | Freq: Every day | ORAL | 3 refills | Status: DC

## 2021-12-09 NOTE — Patient Instructions (Signed)
Medication Changes:  Start Digoxin .'125mg'$  daily  Lab Work:  Labs done today, your results will be available in MyChart, we will contact you for abnormal readings.   Testing/Procedures:  Your physician has requested that you have a cardiac MRI. Cardiac MRI uses a computer to create images of your heart as its beating, producing both still and moving pictures of your heart and major blood vessels. For further information please visit [email or web address] Please follow the instruction sheet given to you today for more information. ONCE APPROVED BY INSURANCE YOU WILL BE CALLED TO ARRANGE THE TEST.    You are scheduled for a Cardiac Catheterization on Thursday, July 27 with Dr. Glori Bickers.  1. Please arrive at the Main Entrance A at [facility]: [place],  [id number] at 9:00 AM (This time is two hours before your procedure to ensure your preparation). Free valet parking service is available.   Special note: Every effort is made to have your procedure done on time. Please understand that emergencies sometimes delay scheduled procedures.  2. Diet: Do not eat solid foods after midnight.  You may have clear liquids until 5 AM upon the day of the procedure.  3. Medication instructions in preparation for your procedure:   Contrast Allergy: No  On the morning of your procedure, take Aspirin and any morning medicines NOT listed above.  You may use sips of water.  5. Plan to go home the same day, you will only stay overnight if medically necessary.  6. You MUST have a responsible adult to drive you home.  7. An adult MUST be with you the first 24 hours after you arrive home.  8. Bring a current list of your medications, and the last time and date medication taken.  9. Bring ID and current insurance cards.  10.Please wear clothes that are easy to get on and off and wear slip-on shoes.  Referrals:  none  Special Instructions //  Education:  none  Follow-Up in: as scheduled   At the [facility], you and your health needs are our priority. We have a designated team specialized in the treatment of Heart Failure. This Care Team includes your primary Heart Failure Specialized Cardiologist (physician), Advanced Practice Providers (APPs- Physician Assistants and Nurse Practitioners), and Pharmacist who all work together to provide you with the care you need, when you need it.   You may see any of the following providers on your designated Care Team at your next follow up:  Dr Glori Bickers Dr Haynes Kerns, NP Lyda Jester, [place] [facility] [place], [place] Audry Riles, PharmD   Please be sure to bring in all your medications bottles to every appointment.   Need to Contact us:  If you have any questions or concerns before your next appointment please send [place] a message through [place] or call our office at [phone].    TO LEAVE A MESSAGE FOR THE NURSE SELECT OPTION 2, PLEASE LEAVE A MESSAGE INCLUDING: YOUR NAME DATE OF BIRTH CALL BACK NUMBER REASON FOR CALL**this is important as we prioritize the call backs  YOU WILL RECEIVE A CALL BACK THE SAME DAY AS LONG AS YOU CALL BEFORE 4:00 PM

## 2021-12-09 NOTE — Progress Notes (Signed)
ADVANCED HF CLINIC CONSULT NOTE  Referring Physician: Dr Acie Fredrickson  Primary Care: Dr Kenton Kingfisher  Primary Cardiologist: Dr Acie Fredrickson   HPI: Jenna Stewart is a 71 year old referred to the HF clinic by Dr Acie Fredrickson for heart failure consultation.   Jenna Stewart  is a 71 year old with a history of chronic heart failure, breast cancer (treated with chemo adriamycin 1994 and radiation), and hyperlipidmia. No previous coronary disease.  Back in the fall she noticed a cough with shortness of breath. Due to progressive dyspnea + orthophnea . PCP started antibiotics.   She was referred to Dr Acie Fredrickson. He started GDMT.  BB and ARNi  stopped due to hypotension. SBP at home 90s . She has felt better over the last month. Denies SOB/orthopnea. No fever or chills. Weight at home 136 pounds. Appetite improved.   11/2021 Echo EF 20-25% RV mildly reduced.     Review of Systems: [y] = yes, '[ ]'$  = no   General: Weight gain '[ ]'$ ; Weight loss '[ ]'$ ; Anorexia '[ ]'$ ; Fatigue [ Y]; Fever '[ ]'$ ; Chills '[ ]'$ ; Weakness [Y ]  Cardiac: Chest pain/pressure '[ ]'$ ; Resting SOB '[ ]'$ ; Exertional SOB '[ ]'$ ; Orthopnea '[ ]'$ ; Pedal Edema '[ ]'$ ; Palpitations '[ ]'$ ; Syncope '[ ]'$ ; Presyncope '[ ]'$ ; Paroxysmal nocturnal dyspnea'[ ]'$   Pulmonary: Cough '[ ]'$ ; Wheezing'[ ]'$ ; Hemoptysis'[ ]'$ ; Sputum '[ ]'$ ; Snoring '[ ]'$   GI: Vomiting'[ ]'$ ; Dysphagia'[ ]'$ ; Melena'[ ]'$ ; Hematochezia '[ ]'$ ; Heartburn'[ ]'$ ; Abdominal pain '[ ]'$ ; Constipation '[ ]'$ ; Diarrhea '[ ]'$ ; BRBPR '[ ]'$   GU: Hematuria'[ ]'$ ; Dysuria '[ ]'$ ; Nocturia'[ ]'$   Vascular: Pain in legs with walking '[ ]'$ ; Pain in feet with lying flat '[ ]'$ ; Non-healing sores '[ ]'$ ; Stroke '[ ]'$ ; TIA '[ ]'$ ; Slurred speech '[ ]'$ ;  Neuro: Headaches'[ ]'$ ; Vertigo'[ ]'$ ; Seizures'[ ]'$ ; Paresthesias'[ ]'$ ;Blurred vision '[ ]'$ ; Diplopia '[ ]'$ ; Vision changes '[ ]'$   Ortho/Skin: Arthritis '[ ]'$ ; Joint pain '[ ]'$ ; Muscle pain '[ ]'$ ; Joint swelling '[ ]'$ ; Back Pain '[ ]'$ ; Rash '[ ]'$   Psych: Depression'[ ]'$ ; Anxiety'[ ]'$   Heme: Bleeding problems '[ ]'$ ; Clotting disorders '[ ]'$ ; Anemia '[ ]'$   Endocrine: Diabetes '[ ]'$ ;  Thyroid dysfunction'[ ]'$    Past Medical History:  Diagnosis Date   Anxiety    Breast cancer (HCC)    Cancer ([place])    History of colonic polyps    Hyperlipemia    Osteopenia     Current Outpatient Medications  Medication Sig Dispense Refill   aspirin EC 81 MG tablet Take 81 mg by mouth daily. Swallow whole.     calcium carbonate 1250 MG capsule Take 1,250 mg by mouth 2 (two) times daily with a meal.     empagliflozin (JARDIANCE) 10 MG TABS tablet Take 1 tablet (10 mg total) by mouth daily before breakfast. 90 tablet 3   FLUoxetine (PROZAC) 20 MG capsule Take 1 capsule by mouth daily.     Multiple Vitamin (MULTIVITAMIN) tablet Take 1 tablet by mouth daily.     niacin (NIASPAN) 750 MG CR tablet Take 750 mg by mouth at bedtime.     raloxifene (EVISTA) 60 MG tablet Take 60 mg by mouth daily.     spironolactone (ALDACTONE) 25 MG tablet Take 12.5 mg by mouth daily.     valsartan (DIOVAN) 40 MG tablet Take 40 mg by mouth daily.     VITAMIN D, CHOLECALCIFEROL, PO Take 1 capsule by mouth daily.     No current facility-administered medications  for this encounter.    No Known Allergies    Social History   Socioeconomic History   Marital status: Married    Spouse name: Not on file   Number of children: Not on file   Years of education: Not on file   Highest education level: Not on file  Occupational History   Not on file  Tobacco Use   Smoking status: Never   Smokeless tobacco: Not on file  Substance and Sexual Activity   Alcohol use: Never   Drug use: Not on file   Sexual activity: Not on file  Other Topics Concern   Not on file  Social History Narrative   Not on file   Social Determinants of Health   Financial Resource Strain: Not on file  Food Insecurity: Not on file  Transportation Needs: Not on file  Physical Activity: Not on file  Stress: Not on file  Social Connections: Not on file  Intimate Partner Violence: Not on file      Family History  Problem Relation  Age of Onset   Ulcerative colitis Father    Hypertension Sister    Diabetes Sister    Uterine cancer Maternal Grandmother     Vitals:   12/09/21 1018  BP: 102/60  Pulse: (!) 101  SpO2: 98%  Weight: 62.1 kg (136 lb 12.8 oz)    PHYSICAL EXAM: General:  . No respiratory difficulty HEENT: normal Neck: supple. no JVD. Carotids 2+ bilat; no bruits. No lymphadenopathy or thryomegaly appreciated. Cor: PMI nondisplaced. Tachy/Regular rate & rhythm. No rubs, gallops or murmurs. Lungs: clear Abdomen: soft, nontender, nondistended. No hepatosplenomegaly. No bruits or masses. Good bowel sounds. Extremities: no cyanosis, clubbing, rash, edema Neuro: alert & oriented x 3, cranial nerves grossly intact. moves all 4 extremities w/o difficulty. Affect pleasant.  ECG: Sinus Tach 111 bpm LVH. Narrow QRS   ASSESSMENT & PLAN:  1. Chronic HFrEF Echo EF 20-25%. Possible chemi induced or viral.  NYHA III. Volume status stable.  - GDMT limited by hypotension.. - Hold bb.  -Add digoxin 0.125 mg daily. - Hold Arni. Continue low dose valsartan at bed time - Continue jardiance 10 mg daily  - Continue spironolactone - Set up for cath - Set up CMRI  2. Sinus Tach  Add dig as above.   3. Breast Cancer S/P L mastectomy Treated with adriamycin 1994  Follow up in 3 weeks  [name] NP-C  11:10 AM'  Patient seen and examined with the above-signed Advanced Practice Provider and/or Housestaff. I personally reviewed laboratory data, imaging studies and relevant notes. I independently examined the patient and formulated the important aspects of the plan. I have edited the note to reflect any of my changes or salient points. I have personally discussed the plan with the patient and/or family.  71 y/o woman with PHx significant only for previous breast CA treated with adriamycin ~20 yrs ago. Recently developed systolic HF with severe LV dysfunction.   HF meds have been titrated but EF remains 20-25%.  No other significant HF RF.   Remains tachycardic. ECG with prominent volts/LVH despite no h/o HTN  NYHA II  General:  Well appearing. No resp difficulty HEENT: normal Neck: supple. no JVD. Carotids 2+ bilat; no bruits. No lymphadenopathy or thryomegaly appreciated. Cor: PMI nondisplaced. Regular tachy. No rubs, gallops or murmurs. Lungs: clear Abdomen: soft, nontender, nondistended. No hepatosplenomegaly. No bruits or masses. Good bowel sounds. Extremities: no cyanosis, clubbing, rash, edema Neuro: alert & orientedx3, cranial  nerves grossly intact. moves all 4 extremities w/o difficulty. Affect pleasant  Symptomatically improved but remains tachy. Agree with adding digoxin.   Will plan R/L cath followed by cMRI to further evaluate etiology of cardiomyopathy. Presence of marked LVH on ECG in the absence of HTN raises concern for infiltrative process. Await MRI.   Glori Bickers, MD  3:59 PM

## 2021-12-09 NOTE — H&P (View-Only) (Signed)
ADVANCED HF CLINIC CONSULT NOTE  Referring Physician: Dr Acie Fredrickson  Primary Care: Dr Kenton Kingfisher  Primary Cardiologist: Dr Acie Fredrickson   HPI: [name] is a 71 year old referred to the HF clinic by Dr Acie Fredrickson for heart failure consultation.   [name]  is a 71 year old with a history of chronic heart failure, breast cancer (treated with chemo adriamycin 1994 and radiation), and hyperlipidmia. No previous coronary disease.  Back in the fall she noticed a cough with shortness of breath. Due to progressive dyspnea + orthophnea . PCP started antibiotics.   She was referred to Dr Acie Fredrickson. He started GDMT.  BB and ARNi  stopped due to hypotension. SBP at home 90s . She has felt better over the last month. Denies SOB/orthopnea. No fever or chills. Weight at home 136 pounds. Appetite improved.   11/2021 Echo EF 20-25% RV mildly reduced.     Review of Systems: [y] = yes, '[ ]'$  = no   General: Weight gain '[ ]'$ ; Weight loss '[ ]'$ ; Anorexia '[ ]'$ ; Fatigue [ Y]; Fever '[ ]'$ ; Chills '[ ]'$ ; Weakness [Y ]  Cardiac: Chest pain/pressure '[ ]'$ ; Resting SOB '[ ]'$ ; Exertional SOB '[ ]'$ ; Orthopnea '[ ]'$ ; Pedal Edema '[ ]'$ ; Palpitations '[ ]'$ ; Syncope '[ ]'$ ; Presyncope '[ ]'$ ; Paroxysmal nocturnal dyspnea'[ ]'$   Pulmonary: Cough '[ ]'$ ; Wheezing'[ ]'$ ; Hemoptysis'[ ]'$ ; Sputum '[ ]'$ ; Snoring '[ ]'$   GI: Vomiting'[ ]'$ ; Dysphagia'[ ]'$ ; Melena'[ ]'$ ; Hematochezia '[ ]'$ ; Heartburn'[ ]'$ ; Abdominal pain '[ ]'$ ; Constipation '[ ]'$ ; Diarrhea '[ ]'$ ; BRBPR '[ ]'$   GU: Hematuria'[ ]'$ ; Dysuria '[ ]'$ ; Nocturia'[ ]'$   Vascular: Pain in legs with walking '[ ]'$ ; Pain in feet with lying flat '[ ]'$ ; Non-healing sores '[ ]'$ ; Stroke '[ ]'$ ; TIA '[ ]'$ ; Slurred speech '[ ]'$ ;  Neuro: Headaches'[ ]'$ ; Vertigo'[ ]'$ ; Seizures'[ ]'$ ; Paresthesias'[ ]'$ ;Blurred vision '[ ]'$ ; Diplopia '[ ]'$ ; Vision changes '[ ]'$   Ortho/Skin: Arthritis '[ ]'$ ; Joint pain '[ ]'$ ; Muscle pain '[ ]'$ ; Joint swelling '[ ]'$ ; Back Pain '[ ]'$ ; Rash '[ ]'$   Psych: Depression'[ ]'$ ; Anxiety'[ ]'$   Heme: Bleeding problems '[ ]'$ ; Clotting disorders '[ ]'$ ; Anemia '[ ]'$   Endocrine: Diabetes '[ ]'$ ;  Thyroid dysfunction'[ ]'$    Past Medical History:  Diagnosis Date   Anxiety    Breast cancer (HCC)    Cancer ([place])    History of colonic polyps    Hyperlipemia    Osteopenia     Current Outpatient Medications  Medication Sig Dispense Refill   aspirin EC 81 MG tablet Take 81 mg by mouth daily. Swallow whole.     calcium carbonate 1250 MG capsule Take 1,250 mg by mouth 2 (two) times daily with a meal.     empagliflozin (JARDIANCE) 10 MG TABS tablet Take 1 tablet (10 mg total) by mouth daily before breakfast. 90 tablet 3   FLUoxetine (PROZAC) 20 MG capsule Take 1 capsule by mouth daily.     Multiple Vitamin (MULTIVITAMIN) tablet Take 1 tablet by mouth daily.     niacin (NIASPAN) 750 MG CR tablet Take 750 mg by mouth at bedtime.     raloxifene (EVISTA) 60 MG tablet Take 60 mg by mouth daily.     spironolactone (ALDACTONE) 25 MG tablet Take 12.5 mg by mouth daily.     valsartan (DIOVAN) 40 MG tablet Take 40 mg by mouth daily.     VITAMIN D, CHOLECALCIFEROL, PO Take 1 capsule by mouth daily.     No current facility-administered medications  for this encounter.    No Known Allergies    Social History   Socioeconomic History   Marital status: Married    Spouse name: Not on file   Number of children: Not on file   Years of education: Not on file   Highest education level: Not on file  Occupational History   Not on file  Tobacco Use   Smoking status: Never   Smokeless tobacco: Not on file  Substance and Sexual Activity   Alcohol use: Never   Drug use: Not on file   Sexual activity: Not on file  Other Topics Concern   Not on file  Social History Narrative   Not on file   Social Determinants of Health   Financial Resource Strain: Not on file  Food Insecurity: Not on file  Transportation Needs: Not on file  Physical Activity: Not on file  Stress: Not on file  Social Connections: Not on file  Intimate Partner Violence: Not on file      Family History  Problem Relation  Age of Onset   Ulcerative colitis Father    Hypertension Sister    Diabetes Sister    Uterine cancer Maternal Grandmother     Vitals:   12/09/21 1018  BP: 102/60  Pulse: (!) 101  SpO2: 98%  Weight: 62.1 kg (136 lb 12.8 oz)    PHYSICAL EXAM: General:  . No respiratory difficulty HEENT: normal Neck: supple. no JVD. Carotids 2+ bilat; no bruits. No lymphadenopathy or thryomegaly appreciated. Cor: PMI nondisplaced. Tachy/Regular rate & rhythm. No rubs, gallops or murmurs. Lungs: clear Abdomen: soft, nontender, nondistended. No hepatosplenomegaly. No bruits or masses. Good bowel sounds. Extremities: no cyanosis, clubbing, rash, edema Neuro: alert & oriented x 3, cranial nerves grossly intact. moves all 4 extremities w/o difficulty. Affect pleasant.  ECG: Sinus Tach 111 bpm LVH. Narrow QRS   ASSESSMENT & PLAN:  1. Chronic HFrEF Echo EF 20-25%. Possible chemi induced or viral.  NYHA III. Volume status stable.  - GDMT limited by hypotension.. - Hold bb.  -Add digoxin 0.125 mg daily. - Hold Arni. Continue low dose valsartan at bed time - Continue jardiance 10 mg daily  - Continue spironolactone - Set up for cath - Set up CMRI  2. Sinus Tach  Add dig as above.   3. Breast Cancer S/P L mastectomy Treated with adriamycin 1994  Follow up in 3 weeks  [name] NP-C  11:10 AM'  Patient seen and examined with the above-signed Advanced Practice Provider and/or Housestaff. I personally reviewed laboratory data, imaging studies and relevant notes. I independently examined the patient and formulated the important aspects of the plan. I have edited the note to reflect any of my changes or salient points. I have personally discussed the plan with the patient and/or family.  71 y/o woman with PHx significant only for previous breast CA treated with adriamycin ~20 yrs ago. Recently developed systolic HF with severe LV dysfunction.   HF meds have been titrated but EF remains 20-25%.  No other significant HF RF.   Remains tachycardic. ECG with prominent volts/LVH despite no h/o HTN  NYHA II  General:  Well appearing. No resp difficulty HEENT: normal Neck: supple. no JVD. Carotids 2+ bilat; no bruits. No lymphadenopathy or thryomegaly appreciated. Cor: PMI nondisplaced. Regular tachy. No rubs, gallops or murmurs. Lungs: clear Abdomen: soft, nontender, nondistended. No hepatosplenomegaly. No bruits or masses. Good bowel sounds. Extremities: no cyanosis, clubbing, rash, edema Neuro: alert & orientedx3, cranial  nerves grossly intact. moves all 4 extremities w/o difficulty. Affect pleasant  Symptomatically improved but remains tachy. Agree with adding digoxin.   Will plan R/L cath followed by cMRI to further evaluate etiology of cardiomyopathy. Presence of marked LVH on ECG in the absence of HTN raises concern for infiltrative process. Await MRI.   Glori Bickers, MD  3:59 PM

## 2021-12-15 ENCOUNTER — Telehealth (HOSPITAL_COMMUNITY): Payer: Self-pay | Admitting: *Deleted

## 2021-12-15 NOTE — Telephone Encounter (Signed)
   Cath precert in RN review

## 2021-12-16 ENCOUNTER — Telehealth (HOSPITAL_COMMUNITY): Payer: Self-pay

## 2021-12-16 NOTE — Telephone Encounter (Signed)
Spoke to patient about catheterization scheduled for tomorrow. Pt aware of arrival time,nothing to eat or drink after midnight tonight.Has transportation to and from procedure.

## 2021-12-17 ENCOUNTER — Other Ambulatory Visit: Payer: Self-pay

## 2021-12-17 ENCOUNTER — Ambulatory Visit (HOSPITAL_COMMUNITY)
Admission: RE | Admit: 2021-12-17 | Discharge: 2021-12-17 | Disposition: A | Discharge status: Home / Self Care | Payer: Medicare PPO | Attending: Internal Medicine | Admitting: Internal Medicine

## 2021-12-17 ENCOUNTER — Encounter (HOSPITAL_COMMUNITY)
Admission: RE | Disposition: A | Discharge status: Home / Self Care | Payer: Self-pay | Source: Home / Self Care | Attending: Internal Medicine

## 2021-12-17 DIAGNOSIS — I5022 Chronic systolic (congestive) heart failure: Secondary | ICD-10-CM | POA: Diagnosis not present

## 2021-12-17 DIAGNOSIS — I428 Other cardiomyopathies: Secondary | ICD-10-CM | POA: Diagnosis not present

## 2021-12-17 DIAGNOSIS — Z853 Personal history of malignant neoplasm of breast: Secondary | ICD-10-CM | POA: Insufficient documentation

## 2021-12-17 DIAGNOSIS — R000 Tachycardia, unspecified: Secondary | ICD-10-CM | POA: Diagnosis not present

## 2021-12-17 DIAGNOSIS — Z9012 Acquired absence of left breast and nipple: Secondary | ICD-10-CM | POA: Diagnosis not present

## 2021-12-17 DIAGNOSIS — Z79899 Other long term (current) drug therapy: Secondary | ICD-10-CM | POA: Diagnosis not present

## 2021-12-17 HISTORY — PX: RIGHT/LEFT HEART CATH AND CORONARY ANGIOGRAPHY: CATH118266

## 2021-12-17 LAB — POCT I-STAT EG7
Acid-base deficit: 1 mmol/L (ref 0.0–2.0)
Acid-base deficit: 2 mmol/L (ref 0.0–2.0)
Bicarbonate: 23.6 mmol/L (ref 20.0–28.0)
Bicarbonate: 24.8 mmol/L (ref 20.0–28.0)
Calcium, Ion: 1.17 mmol/L (ref 1.15–1.40)
Calcium, Ion: 1.28 mmol/L (ref 1.15–1.40)
HCT: 40 % (ref 36.0–46.0)
HCT: 41 % (ref 36.0–46.0)
Hemoglobin: 13.6 g/dL (ref 12.0–15.0)
Hemoglobin: 13.9 g/dL (ref 12.0–15.0)
O2 Saturation: 74 %
O2 Saturation: 75 %
Potassium: 3.9 mmol/L (ref 3.5–5.1)
Potassium: 4.1 mmol/L (ref 3.5–5.1)
Sodium: 141 mmol/L (ref 135–145)
Sodium: 142 mmol/L (ref 135–145)
TCO2: 25 mmol/L (ref 22–32)
TCO2: 26 mmol/L (ref 22–32)
pCO2, Ven: 40.9 mmHg — ABNORMAL LOW (ref 44–60)
pCO2, Ven: 43.3 mmHg — ABNORMAL LOW (ref 44–60)
pH, Ven: 7.367 (ref 7.25–7.43)
pH, Ven: 7.369 (ref 7.25–7.43)
pO2, Ven: 40 mmHg (ref 32–45)
pO2, Ven: 41 mmHg (ref 32–45)

## 2021-12-17 LAB — POCT I-STAT 7, (LYTES, BLD GAS, ICA,H+H)
Acid-base deficit: 3 mmol/L — ABNORMAL HIGH (ref 0.0–2.0)
Bicarbonate: 21.9 mmol/L (ref 20.0–28.0)
Calcium, Ion: 1.06 mmol/L — ABNORMAL LOW (ref 1.15–1.40)
HCT: 38 % (ref 36.0–46.0)
Hemoglobin: 12.9 g/dL (ref 12.0–15.0)
O2 Saturation: 93 %
Potassium: 3.6 mmol/L (ref 3.5–5.1)
Sodium: 144 mmol/L (ref 135–145)
TCO2: 23 mmol/L (ref 22–32)
pCO2 arterial: 37.3 mmHg (ref 32–48)
pH, Arterial: 7.377 (ref 7.35–7.45)
pO2, Arterial: 67 mmHg — ABNORMAL LOW (ref 83–108)

## 2021-12-17 LAB — CARDIAC CATHETERIZATION: Cath EF Quantitative: 30 %

## 2021-12-17 SURGERY — RIGHT/LEFT HEART CATH AND CORONARY ANGIOGRAPHY
Anesthesia: LOCAL

## 2021-12-17 MED ORDER — FENTANYL CITRATE (PF) 100 MCG/2ML IJ SOLN
INTRAMUSCULAR | Status: DC | PRN
  Administered 2021-12-17: 25 ug via INTRAVENOUS

## 2021-12-17 MED ORDER — SODIUM CHLORIDE 0.9 % IV SOLN: 250.0000 mL | INTRAVENOUS | Status: DC | PRN

## 2021-12-17 MED ORDER — LIDOCAINE HCL (PF) 1 % IJ SOLN
INTRAMUSCULAR | Status: AC
  Filled 2021-12-17: qty 30

## 2021-12-17 MED ORDER — SODIUM CHLORIDE 0.9% FLUSH
3.0000 mL | INTRAVENOUS | Status: DC | PRN
Start: 2021-12-17 — End: 2021-12-17

## 2021-12-17 MED ORDER — HEPARIN SODIUM (PORCINE) 1000 UNIT/ML IJ SOLN
INTRAMUSCULAR | Status: DC | PRN
  Administered 2021-12-17: 3000 [IU] via INTRAVENOUS

## 2021-12-17 MED ORDER — ACETAMINOPHEN 325 MG PO TABS: 650.0000 mg | ORAL_TABLET | ORAL | Status: DC | PRN

## 2021-12-17 MED ORDER — MIDAZOLAM HCL 2 MG/2ML IJ SOLN
INTRAMUSCULAR | Status: AC
  Filled 2021-12-17: qty 2

## 2021-12-17 MED ORDER — FENTANYL CITRATE (PF) 100 MCG/2ML IJ SOLN
INTRAMUSCULAR | Status: AC
  Filled 2021-12-17: qty 2

## 2021-12-17 MED ORDER — HEPARIN SODIUM (PORCINE) 1000 UNIT/ML IJ SOLN
INTRAMUSCULAR | Status: AC
  Filled 2021-12-17: qty 10

## 2021-12-17 MED ORDER — LABETALOL HCL 5 MG/ML IV SOLN: 10.0000 mg | INTRAVENOUS | Status: DC | PRN

## 2021-12-17 MED ORDER — ONDANSETRON HCL 4 MG/2ML IJ SOLN: 4.0000 mg | Freq: Four times a day (QID) | INTRAMUSCULAR | Status: DC | PRN

## 2021-12-17 MED ORDER — MIDAZOLAM HCL 2 MG/2ML IJ SOLN
INTRAMUSCULAR | Status: DC | PRN
  Administered 2021-12-17 (×3): 1 mg via INTRAVENOUS

## 2021-12-17 MED ORDER — SODIUM CHLORIDE 0.9 % IV SOLN: INTRAVENOUS | Status: DC

## 2021-12-17 MED ORDER — HEPARIN (PORCINE) IN NACL 1000-0.9 UT/500ML-% IV SOLN
INTRAVENOUS | Status: AC
  Filled 2021-12-17: qty 1000

## 2021-12-17 MED ORDER — HEPARIN (PORCINE) IN NACL 1000-0.9 UT/500ML-% IV SOLN
INTRAVENOUS | Status: DC | PRN
  Administered 2021-12-17 (×2): 500 mL

## 2021-12-17 MED ORDER — VERAPAMIL HCL 2.5 MG/ML IV SOLN
INTRAVENOUS | Status: DC | PRN
  Administered 2021-12-17: 10 mL via INTRA_ARTERIAL

## 2021-12-17 MED ORDER — HYDRALAZINE HCL 20 MG/ML IJ SOLN: 10.0000 mg | INTRAMUSCULAR | Status: DC | PRN

## 2021-12-17 MED ORDER — IOHEXOL 350 MG/ML SOLN
INTRAVENOUS | Status: DC | PRN
  Administered 2021-12-17: 25 mL

## 2021-12-17 MED ORDER — VERAPAMIL HCL 2.5 MG/ML IV SOLN
INTRAVENOUS | Status: AC
  Filled 2021-12-17: qty 2

## 2021-12-17 MED ORDER — SODIUM CHLORIDE 0.9% FLUSH: 3.0000 mL | Freq: Two times a day (BID) | INTRAVENOUS | Status: DC

## 2021-12-17 MED ORDER — ASPIRIN 81 MG PO CHEW: 81.0000 mg | CHEWABLE_TABLET | Freq: Once | ORAL | Status: DC

## 2021-12-17 MED ORDER — SODIUM CHLORIDE 0.9% FLUSH: 3.0000 mL | INTRAVENOUS | Status: DC | PRN

## 2021-12-17 MED ORDER — LIDOCAINE HCL (PF) 1 % IJ SOLN
INTRAMUSCULAR | Status: DC | PRN
  Administered 2021-12-17 (×3): 2 mL via INTRADERMAL

## 2021-12-17 SURGICAL SUPPLY — 13 items
BAND ZEPHYR COMPRESS 30 LONG (HEMOSTASIS) ×1 IMPLANT
CATH 5FR JL3.5 JR4 ANG PIG MP (CATHETERS) ×1 IMPLANT
CATH BALLN WEDGE 5F 110CM (CATHETERS) ×1 IMPLANT
CATH SWAN GANZ 7F STRAIGHT (CATHETERS) ×1 IMPLANT
GLIDESHEATH SLEND SS 6F .021 (SHEATH) ×1 IMPLANT
GUIDEWIRE .025 260CM (WIRE) ×1 IMPLANT
GUIDEWIRE INQWIRE 1.5J.035X260 (WIRE) IMPLANT
INQWIRE 1.5J .035X260CM (WIRE) ×2
PACK CARDIAC CATHETERIZATION (CUSTOM PROCEDURE TRAY) ×2 IMPLANT
SHEATH GLIDE SLENDER 4/5FR (SHEATH) ×1 IMPLANT
SHEATH PINNACLE 7F 10CM (SHEATH) ×1 IMPLANT
SHEATH PROBE COVER 6X72 (BAG) ×1 IMPLANT
TRANSDUCER W/STOPCOCK (MISCELLANEOUS) ×2 IMPLANT

## 2021-12-17 NOTE — Interval H&P Note (Signed)
History and Physical Interval Note:  12/17/2021 1:22 PM  Jenna Stewart  has presented today for surgery, with the diagnosis of NEW ONSET OF CHF.  The various methods of treatment have been discussed with the patient and family. After consideration of risks, benefits and other options for treatment, the patient has consented to  Procedure(s): RIGHT/LEFT HEART CATH AND CORONARY ANGIOGRAPHY (N/A) as a surgical intervention.  The patient's history has been reviewed, patient examined, no change in status, stable for surgery.  I have reviewed the patient's chart and labs.  Questions were answered to the patient's satisfaction.     [name]

## 2021-12-18 ENCOUNTER — Encounter (HOSPITAL_COMMUNITY): Payer: Self-pay | Admitting: Internal Medicine

## 2022-01-12 ENCOUNTER — Encounter (HOSPITAL_COMMUNITY): Payer: Self-pay | Admitting: Internal Medicine

## 2022-01-12 ENCOUNTER — Ambulatory Visit (HOSPITAL_COMMUNITY)
Admission: RE | Admit: 2022-01-12 | Discharge: 2022-01-12 | Disposition: A | Discharge status: Home / Self Care | Payer: Medicare PPO | Source: Ambulatory Visit | Attending: Internal Medicine | Admitting: Internal Medicine

## 2022-01-12 VITALS — BP 108/60 | HR 99 | Wt 137.4 lb

## 2022-01-12 DIAGNOSIS — Z9012 Acquired absence of left breast and nipple: Secondary | ICD-10-CM | POA: Diagnosis not present

## 2022-01-12 DIAGNOSIS — I5022 Chronic systolic (congestive) heart failure: Secondary | ICD-10-CM | POA: Diagnosis not present

## 2022-01-12 DIAGNOSIS — Z853 Personal history of malignant neoplasm of breast: Secondary | ICD-10-CM | POA: Insufficient documentation

## 2022-01-12 DIAGNOSIS — Z7984 Long term (current) use of oral hypoglycemic drugs: Secondary | ICD-10-CM | POA: Diagnosis not present

## 2022-01-12 DIAGNOSIS — I428 Other cardiomyopathies: Secondary | ICD-10-CM | POA: Diagnosis not present

## 2022-01-12 DIAGNOSIS — Z79899 Other long term (current) drug therapy: Secondary | ICD-10-CM | POA: Insufficient documentation

## 2022-01-12 DIAGNOSIS — I959 Hypotension, unspecified: Secondary | ICD-10-CM | POA: Insufficient documentation

## 2022-01-12 LAB — BASIC METABOLIC PANEL
Anion gap: 10 (ref 5–15)
BUN: 16 mg/dL (ref 8–23)
CO2: 23 mmol/L (ref 22–32)
Calcium: 9.7 mg/dL (ref 8.9–10.3)
Chloride: 106 mmol/L (ref 98–111)
Creatinine, Ser: 0.82 mg/dL (ref 0.44–1.00)
GFR, Estimated: >60 mL/min (ref >60)
Glucose, Bld: 95 mg/dL (ref 70–99)
Potassium: 4.7 mmol/L (ref 3.5–5.1)
Sodium: 139 mmol/L (ref 135–145)

## 2022-01-12 LAB — BRAIN NATRIURETIC PEPTIDE: B Natriuretic Peptide: 249.1 pg/mL — ABNORMAL HIGH (ref 0.0–100.0)

## 2022-01-12 MED ORDER — METOPROLOL SUCCINATE ER 25 MG PO TB24: 25.0000 mg | ORAL_TABLET | Freq: Every day | ORAL | 6 refills | Status: DC

## 2022-01-12 NOTE — Progress Notes (Signed)
ADVANCED HF CLINIC  NOTE  Referring Physician: Dr Acie Fredrickson  Primary Care: Dr Kenton Kingfisher  Primary Cardiologist: Dr Acie Fredrickson   HPI:  Jenna Stewart  is a 71 year old with systolic heart failure, breast cancer (treated with chemo adriamycin 1994 and radiation), and hyperlipidmia.    Back in the fall she noticed a cough with shortness of breath. Due to progressive dyspnea + orthophnea. PCP started antibiotics.   Echo 4/23 EF 25-30%  She was referred to Dr Acie Fredrickson. He started GDMT.  BB and ARNi  stopped due to hypotension. SBP at home 90s .  11/2021 Echo EF 20-25% RV mildly reduced.    [facility] 7/27 showed normal cors, severe NICM EF 30% with well compensated hemodynamics Ao = 110/51 (76) LV = 104/4 RA = 1 RV = 23/1 PA = 15/4 (10) PCW = 1 Fick cardiac output/index = 6.4/3.77 Thermo CO/CI = 4.7/2.8 PVR = 1.4 WU FA sat = 93% PA sat = 74%, 75% SVC sat = 81%  Here for post-cath follow-up. Feels good. Says she is in the green zone. Can do housework and work in [employer] without problem. Can go up a few steps and go through store without problem. No CP, edema, orthopnea or PND.  cMRI schedule for 10/10. AM blood pressures running 78-100 the rest of the day mostly 90-105. Occasionally woozy. HR 80-100    Past Medical History:  Diagnosis Date   Anxiety    Breast cancer ([place])    Cancer ([place])    History of colonic polyps    Hyperlipemia    Osteopenia     Current Outpatient Medications  Medication Sig Dispense Refill   aspirin EC 81 MG tablet Take 81 mg by mouth daily. Swallow whole.     calcium carbonate 1250 MG capsule Take 1,250 mg by mouth 2 (two) times daily with a meal.     cholecalciferol (VITAMIN D) 25 MCG (1000 UNIT) tablet Take 1,000 Units by mouth daily.     digoxin (LANOXIN) 0.125 MG tablet Take 1 tablet (0.125 mg total) by mouth daily. 90 tablet 3   empagliflozin (JARDIANCE) 10 MG TABS tablet Take 1 tablet (10 mg total) by mouth daily before breakfast. 90 tablet 3    FLUoxetine (PROZAC) 20 MG capsule Take 20 mg by mouth daily.     Multiple Vitamin (MULTIVITAMIN) tablet Take 1 tablet by mouth daily.     niacin (NIASPAN) 750 MG CR tablet Take 750 mg by mouth at bedtime.     raloxifene (EVISTA) 60 MG tablet Take 60 mg by mouth daily.     spironolactone (ALDACTONE) 25 MG tablet Take 12.5 mg by mouth daily.     tretinoin (RETIN-A) 0.1 % cream Apply 1 application  topically at bedtime.     valsartan (DIOVAN) 40 MG tablet Take 40 mg by mouth daily.     No current facility-administered medications for this encounter.    No Known Allergies    Social History   Socioeconomic History   Marital status: Married    Spouse name: Not on file   Number of children: Not on file   Years of education: Not on file   Highest education level: Not on file  Occupational History   Not on file  Tobacco Use   Smoking status: Never   Smokeless tobacco: Not on file  Substance and Sexual Activity   Alcohol use: Never   Drug use: Not on file   Sexual activity: Not on file  Other  Topics Concern   Not on file  Social History Narrative   Not on file   Social Determinants of Health   Financial Resource Strain: Not on file  Food Insecurity: Not on file  Transportation Needs: Not on file  Physical Activity: Not on file  Stress: Not on file  Social Connections: Not on file  Intimate Partner Violence: Not on file      Family History  Problem Relation Age of Onset   Ulcerative colitis Father    Hypertension Sister    Diabetes Sister    Uterine cancer Maternal Grandmother     Vitals:   01/12/22 1035  BP: 108/60  Pulse: 99  SpO2: 97%  Weight: 62.3 kg (137 lb 6.4 oz)    PHYSICAL EXAM: General:  Well appearing. No resp difficulty HEENT: normal Neck: supple. no JVD. Carotids 2+ bilat; no bruits. No lymphadenopathy or thryomegaly appreciated. Cor: PMI nondisplaced. Regular rate & rhythm. No rubs, gallops or murmurs. Lungs: clear Abdomen: soft, nontender,  nondistended. No hepatosplenomegaly. No bruits or masses. Good bowel sounds. Extremities: no cyanosis, clubbing, rash, edema Neuro: alert & orientedx3, cranial nerves grossly intact. moves all 4 extremities w/o difficulty. Affect pleasant   ASSESSMENT & PLAN:  1. 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 No further details available  - Echo EF 20-25%. Suspect delayed adriamycin toxicity. However, marked LVH on ECG in the absence of HTN raises concern for hypertrophic CM or infiltrative process - Cath 7/23 no CAD - NYHA III. Volume status stable.  - GDMT limited by hypotension.. - Hold bb.  - Continue digoxin 0.125 mg daily. - Continue low dose valsartan 40  at bed time - Continue jardiance 10 mg daily  - Continue spironolactone - Start Toprol XL 25 qhs - cMRI pending 10/10 - will see if we can move it up   2. Breast Cancer S/P L mastectomy Treated with adriamycin 1994   Glori Bickers MD 10:56 AM'

## 2022-01-12 NOTE — Patient Instructions (Signed)
Medication Changes:  START Metoprolol Succinate 25 mg Daily AT BEDTIME  Lab Work:  Labs done today, your results will be available in MyChart, we will contact you for abnormal readings.  Testing/Procedures:  none  Referrals:  none  Special Instructions // Education:  Do the following things EVERYDAY: Weigh yourself in the morning before breakfast. Write it down and keep it in a log. Take your medicines as prescribed Eat low salt foods--Limit salt (sodium) to 2000 mg per day.  Stay as active as you can everyday Limit all fluids for the day to less than 2 liters   Follow-Up in: 3-4 months  At the [facility], you and your health needs are our priority. We have a designated team specialized in the treatment of Heart Failure. This Care Team includes your primary Heart Failure Specialized Cardiologist (physician), Advanced Practice Providers (APPs- Physician Assistants and Nurse Practitioners), and Pharmacist who all work together to provide you with the care you need, when you need it.   You may see any of the following providers on your designated Care Team at your next follow up:  Dr Glori Bickers Dr Haynes Kerns, NP Lyda Jester, [place] [facility] [place], [place] Audry Riles, PharmD   Please be sure to bring in all your medications bottles to every appointment.   Need to Contact us:  If you have any questions or concerns before your next appointment please send [place] a message through [place] or call our office at [phone].    TO LEAVE A MESSAGE FOR THE NURSE SELECT OPTION 2, PLEASE LEAVE A MESSAGE INCLUDING: YOUR NAME DATE OF BIRTH CALL BACK NUMBER REASON FOR CALL**this is important as we prioritize the call backs  YOU WILL RECEIVE A CALL BACK THE SAME DAY AS LONG AS YOU CALL BEFORE 4:00 PM

## 2022-01-13 ENCOUNTER — Telehealth (HOSPITAL_COMMUNITY): Payer: Self-pay | Admitting: *Deleted

## 2022-01-14 ENCOUNTER — Other Ambulatory Visit (HOSPITAL_COMMUNITY): Payer: Self-pay | Admitting: Internal Medicine

## 2022-01-14 ENCOUNTER — Ambulatory Visit (HOSPITAL_COMMUNITY)
Admission: RE | Admit: 2022-01-14 | Discharge: 2022-01-14 | Disposition: A | Discharge status: Home / Self Care | Payer: Medicare PPO | Source: Ambulatory Visit | Attending: Internal Medicine | Admitting: Internal Medicine

## 2022-01-14 DIAGNOSIS — I5022 Chronic systolic (congestive) heart failure: Secondary | ICD-10-CM | POA: Diagnosis not present

## 2022-01-14 MED ORDER — GADOBUTROL 1 MMOL/ML IV SOLN
9.0000 mL | Freq: Once | INTRAVENOUS | Status: AC | PRN
  Administered 2022-01-14: 9 mL via INTRAVENOUS

## 2022-02-19 ENCOUNTER — Other Ambulatory Visit (HOSPITAL_COMMUNITY): Payer: Self-pay | Admitting: *Deleted

## 2022-02-19 MED ORDER — VALSARTAN 40 MG PO TABS: 40.0000 mg | ORAL_TABLET | Freq: Every day | ORAL | 3 refills | Status: DC

## 2022-02-23 DIAGNOSIS — L821 Other seborrheic keratosis: Secondary | ICD-10-CM | POA: Diagnosis not present

## 2022-02-23 DIAGNOSIS — L570 Actinic keratosis: Secondary | ICD-10-CM | POA: Diagnosis not present

## 2022-02-23 DIAGNOSIS — L439 Lichen planus, unspecified: Secondary | ICD-10-CM | POA: Diagnosis not present

## 2022-02-23 DIAGNOSIS — D492 Neoplasm of unspecified behavior of bone, soft tissue, and skin: Secondary | ICD-10-CM | POA: Diagnosis not present

## 2022-02-23 DIAGNOSIS — D225 Melanocytic nevi of trunk: Secondary | ICD-10-CM | POA: Diagnosis not present

## 2022-02-23 DIAGNOSIS — L814 Other melanin hyperpigmentation: Secondary | ICD-10-CM | POA: Diagnosis not present

## 2022-03-02 ENCOUNTER — Other Ambulatory Visit (HOSPITAL_COMMUNITY): Payer: Medicare PPO

## 2022-03-04 ENCOUNTER — Other Ambulatory Visit (HOSPITAL_COMMUNITY): Payer: Medicare PPO

## 2022-03-22 DIAGNOSIS — I5022 Chronic systolic (congestive) heart failure: Secondary | ICD-10-CM | POA: Diagnosis not present

## 2022-03-22 DIAGNOSIS — Z853 Personal history of malignant neoplasm of breast: Secondary | ICD-10-CM | POA: Diagnosis not present

## 2022-03-22 DIAGNOSIS — E7800 Pure hypercholesterolemia, unspecified: Secondary | ICD-10-CM | POA: Diagnosis not present

## 2022-03-22 DIAGNOSIS — F419 Anxiety disorder, unspecified: Secondary | ICD-10-CM | POA: Diagnosis not present

## 2022-03-22 DIAGNOSIS — Z23 Encounter for immunization: Secondary | ICD-10-CM | POA: Diagnosis not present

## 2022-03-22 DIAGNOSIS — M8588 Other specified disorders of bone density and structure, other site: Secondary | ICD-10-CM | POA: Diagnosis not present

## 2022-04-12 ENCOUNTER — Ambulatory Visit (HOSPITAL_COMMUNITY)
Admission: RE | Admit: 2022-04-12 | Discharge: 2022-04-12 | Disposition: A | Discharge status: Home / Self Care | Payer: Medicare PPO | Source: Ambulatory Visit | Attending: Internal Medicine | Admitting: Internal Medicine

## 2022-04-12 ENCOUNTER — Encounter (HOSPITAL_COMMUNITY): Payer: Self-pay | Admitting: Internal Medicine

## 2022-04-12 VITALS — BP 108/60 | HR 87 | Wt 136.6 lb

## 2022-04-12 DIAGNOSIS — I5022 Chronic systolic (congestive) heart failure: Secondary | ICD-10-CM | POA: Diagnosis not present

## 2022-04-12 DIAGNOSIS — I428 Other cardiomyopathies: Secondary | ICD-10-CM | POA: Diagnosis not present

## 2022-04-12 DIAGNOSIS — Z9012 Acquired absence of left breast and nipple: Secondary | ICD-10-CM | POA: Insufficient documentation

## 2022-04-12 DIAGNOSIS — C50919 Malignant neoplasm of unspecified site of unspecified female breast: Secondary | ICD-10-CM | POA: Insufficient documentation

## 2022-04-12 DIAGNOSIS — I959 Hypotension, unspecified: Secondary | ICD-10-CM | POA: Diagnosis not present

## 2022-04-12 DIAGNOSIS — Z79899 Other long term (current) drug therapy: Secondary | ICD-10-CM | POA: Insufficient documentation

## 2022-04-12 DIAGNOSIS — Z7984 Long term (current) use of oral hypoglycemic drugs: Secondary | ICD-10-CM | POA: Insufficient documentation

## 2022-04-12 LAB — BASIC METABOLIC PANEL
Anion gap: 12 (ref 5–15)
BUN: 14 mg/dL (ref 8–23)
CO2: 23 mmol/L (ref 22–32)
Calcium: 9.6 mg/dL (ref 8.9–10.3)
Chloride: 106 mmol/L (ref 98–111)
Creatinine, Ser: 0.78 mg/dL (ref 0.44–1.00)
GFR, Estimated: >60 mL/min (ref >60)
Glucose, Bld: 91 mg/dL (ref 70–99)
Potassium: 4.2 mmol/L (ref 3.5–5.1)
Sodium: 141 mmol/L (ref 135–145)

## 2022-04-12 LAB — BRAIN NATRIURETIC PEPTIDE: B Natriuretic Peptide: 196.8 pg/mL — ABNORMAL HIGH (ref 0.0–100.0)

## 2022-04-12 MED ORDER — LOSARTAN POTASSIUM 25 MG PO TABS: 12.5000 mg | ORAL_TABLET | Freq: Every evening | ORAL | 3 refills | Status: DC

## 2022-04-12 NOTE — Progress Notes (Signed)
ADVANCED HF CLINIC  NOTE  Referring Physician: Dr Acie Fredrickson  Primary Care: Dr Kenton Kingfisher  Primary Cardiologist: Dr Acie Fredrickson   HPI:  Jenna Stewart  is a 71 year old with systolic heart failure, breast cancer (treated with chemo adriamycin 1994 and radiation), and hyperlipidmia.    Back in the fall she noticed a cough with shortness of breath. Due to progressive dyspnea + orthophnea. PCP started antibiotics.   Echo 4/23 EF 25-30%  She was referred to Dr Acie Fredrickson. He started GDMT.  BB and ARNi  stopped due to hypotension. SBP at home 90s .  11/2021 Echo EF 20-25% RV mildly reduced.    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 Taking BP every day. SBP in 80s in the am and then come up to 100-110 throughout the day. Feels tired in am. No edema, orthopnea or PND.     Past Medical History:  Diagnosis Date   Anxiety    Breast cancer ([place])    Cancer ([place])    History of colonic polyps    Hyperlipemia    Osteopenia     Current Outpatient Medications  Medication Sig Dispense Refill   aspirin EC 81 MG tablet Take 81 mg by mouth daily. Swallow whole.     calcium carbonate 1250 MG capsule Take 1,250 mg by mouth 2 (two) times daily with a meal.     cholecalciferol (VITAMIN D) 25 MCG (1000 UNIT) tablet Take 1,000 Units by mouth daily.     digoxin (LANOXIN) 0.125 MG tablet Take 1 tablet (0.125 mg total) by mouth daily. 90 tablet 3   empagliflozin (JARDIANCE) 10 MG TABS tablet Take 1 tablet (10 mg total) by mouth daily before breakfast. 90 tablet 3   FLUoxetine (PROZAC) 20 MG capsule Take 20 mg by mouth daily.     metoprolol succinate (TOPROL XL) 25 MG 24 hr tablet Take 1 tablet (25 mg total)  by mouth at bedtime. 30 tablet 6   Multiple Vitamin (MULTIVITAMIN) tablet Take 1 tablet by mouth daily.     niacin (NIASPAN) 750 MG CR tablet Take 750 mg by mouth at bedtime.     raloxifene (EVISTA) 60 MG tablet Take 60 mg by mouth daily.     spironolactone (ALDACTONE) 25 MG tablet Take 12.5 mg by mouth daily.     valsartan (DIOVAN) 40 MG tablet Take 1 tablet (40 mg total) by mouth daily. 90 tablet 3   No current facility-administered medications for this encounter.    No Known Allergies    Social History   Socioeconomic History   Marital status: Married    Spouse name: Not on file   Number of children: Not on file   Years of education: Not on file   Highest education level: Not on file  Occupational History   Not on file  Tobacco Use   Smoking status: Never   Smokeless tobacco: Not on file  Substance and Sexual Activity   Alcohol use: Never   Drug use: Not on file   Sexual activity: Not on file  Other Topics Concern   Not on  file  Social History Narrative   Not on file   Social Determinants of Health   Financial Resource Strain: Not on file  Food Insecurity: Not on file  Transportation Needs: Not on file  Physical Activity: Not on file  Stress: Not on file  Social Connections: Not on file  Intimate Partner Violence: Not on file      Family History  Problem Relation Age of Onset   Ulcerative colitis Father    Hypertension Sister    Diabetes Sister    Uterine cancer Maternal Grandmother     Vitals:   04/12/22 1028  BP: 108/60  Pulse: 87  SpO2: 97%  Weight: 62 kg (136 lb 9.6 oz)    PHYSICAL EXAM: General:  Well appearing. No resp difficulty HEENT: normal Neck: supple. no JVD. Carotids 2+ bilat; no bruits. No lymphadenopathy or thryomegaly appreciated. Cor: PMI nondisplaced. Regular rate & rhythm. No rubs, gallops or murmurs. Lungs: clear Abdomen: soft, nontender, nondistended. No hepatosplenomegaly. No bruits or masses. Good bowel  sounds. Extremities: no cyanosis, clubbing, rash, edema Neuro: alert & orientedx3, cranial nerves grossly intact. moves all 4 extremities w/o difficulty. Affect pleasant   ASSESSMENT & PLAN:  1. 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 No further details available  - Echo EF 20-25%.  - cMRI 8/23: EF 37% RVEF 47% Moderate MR Suspect delayed adriamycin toxicity.  - Cath 7/23 no CAD - NYHA II-III. Volume status stable.  - GDMT limited by hypotension.. - Can stop digoxin - Switch valsartan 40 to losartan 12.'5mg'$  qhs - Continue jardiance 10 mg daily  - Continue spironolactone 12.'5mg'$  daily - Continue Toprol XL 25 qhs - Call me if SBP < 90 - Labs today - Liberalize salt  2. Breast Cancer - S/P L mastectomy - Treated with adriamycin 1994   Glori Bickers MD 11:00 AM'

## 2022-04-12 NOTE — Addendum Note (Signed)
Encounter addended by: Jerl Mina, RN on: 04/12/2022 11:25 AM  Actions taken: Medication long-term status modified, Order list changed, Diagnosis association updated, Clinical Note Signed, Charge Capture section accepted

## 2022-04-12 NOTE — Patient Instructions (Addendum)
STOP Digoxin, Asprin, Valsartan and Niacin.  START Losartan 12.5 mg nightly.  Labs done today, your results will be available in MyChart, we will contact you for abnormal readings.  Your physician has requested that you have an echocardiogram. Echocardiography is a painless test that uses sound waves to create images of your heart. It provides your doctor with information about the size and shape of your heart and how well your heart's chambers and valves are working. This procedure takes approximately one hour. There are no restrictions for this procedure. Please do NOT wear cologne, perfume, aftershave, or lotions (deodorant is allowed). Please arrive 15 minutes prior to your appointment time.  Your physician recommends that you schedule a follow-up appointment in: 4 months with an echocardiogram (March 2024)  ** please call the office in January to arrange your follow up appointment **  If you have any questions or concerns before your next appointment please send [place] a message through [place] or call our office at [phone].    TO LEAVE A MESSAGE FOR THE NURSE SELECT OPTION 2, PLEASE LEAVE A MESSAGE INCLUDING: YOUR NAME DATE OF BIRTH CALL BACK NUMBER REASON FOR CALL**this is important as we prioritize the call backs  YOU WILL RECEIVE A CALL BACK THE SAME DAY AS LONG AS YOU CALL BEFORE 4:00 PM  At the [facility], you and your health needs are our priority. As part of our continuing mission to provide you with exceptional heart care, we have created designated Provider Care Teams. These Care Teams include your primary Cardiologist (physician) and Advanced Practice Providers (APPs- Physician Assistants and Nurse Practitioners) who all work together to provide you with the care you need, when you need it.   You may see any of the following providers on your designated Care Team at your next follow up: Dr Glori Bickers Dr Loralie Champagne Dr. Roxana Hires, NP Lyda Jester, [place] [facility] [place], [place] Forestine Na, NP Audry Riles, PharmD   Please be sure to bring in all your medications bottles to every appointment.

## 2022-06-08 ENCOUNTER — Other Ambulatory Visit (HOSPITAL_COMMUNITY): Payer: Self-pay

## 2022-06-08 MED ORDER — METOPROLOL SUCCINATE ER 25 MG PO TB24: 25.0000 mg | ORAL_TABLET | Freq: Every day | ORAL | 11 refills | Status: DC

## 2022-06-08 NOTE — Telephone Encounter (Signed)
Meds ordered this encounter  Medications   metoprolol succinate (TOPROL XL) 25 MG 24 hr tablet    Sig: Take 1 tablet (25 mg total) by mouth at bedtime.    Dispense:  30 tablet    Refill:  11

## 2022-07-12 ENCOUNTER — Telehealth (HOSPITAL_COMMUNITY): Payer: Self-pay

## 2022-07-12 NOTE — Telephone Encounter (Signed)
Patient called to get her Echo scheduled for March. Can you pleas set up with her?

## 2022-07-16 ENCOUNTER — Telehealth (HOSPITAL_COMMUNITY): Payer: Self-pay

## 2022-07-16 NOTE — Telephone Encounter (Signed)
Can you call patient and set up her Echo for March?

## 2022-08-19 ENCOUNTER — Telehealth: Payer: Self-pay | Admitting: *Deleted

## 2022-08-19 NOTE — Telephone Encounter (Signed)
Echo Authorization [id number]

## 2022-08-24 ENCOUNTER — Ambulatory Visit (HOSPITAL_COMMUNITY)
Admission: RE | Admit: 2022-08-24 | Discharge: 2022-08-24 | Disposition: A | Discharge status: Home / Self Care | Payer: Medicare PPO | Source: Ambulatory Visit | Attending: Family Medicine | Admitting: Family Medicine

## 2022-08-24 ENCOUNTER — Ambulatory Visit (HOSPITAL_BASED_OUTPATIENT_CLINIC_OR_DEPARTMENT_OTHER)
Admission: RE | Admit: 2022-08-24 | Discharge: 2022-08-24 | Disposition: A | Discharge status: Home / Self Care | Payer: Medicare PPO | Source: Ambulatory Visit | Attending: Internal Medicine | Admitting: Internal Medicine

## 2022-08-24 ENCOUNTER — Encounter (HOSPITAL_COMMUNITY): Payer: Self-pay | Admitting: Internal Medicine

## 2022-08-24 VITALS — BP 110/58 | HR 82 | Wt 137.2 lb

## 2022-08-24 DIAGNOSIS — Z853 Personal history of malignant neoplasm of breast: Secondary | ICD-10-CM | POA: Diagnosis not present

## 2022-08-24 DIAGNOSIS — I428 Other cardiomyopathies: Secondary | ICD-10-CM | POA: Diagnosis not present

## 2022-08-24 DIAGNOSIS — Z79899 Other long term (current) drug therapy: Secondary | ICD-10-CM | POA: Diagnosis not present

## 2022-08-24 DIAGNOSIS — I5041 Acute combined systolic (congestive) and diastolic (congestive) heart failure: Secondary | ICD-10-CM

## 2022-08-24 DIAGNOSIS — R000 Tachycardia, unspecified: Secondary | ICD-10-CM | POA: Diagnosis not present

## 2022-08-24 DIAGNOSIS — Z7984 Long term (current) use of oral hypoglycemic drugs: Secondary | ICD-10-CM | POA: Insufficient documentation

## 2022-08-24 DIAGNOSIS — I5022 Chronic systolic (congestive) heart failure: Secondary | ICD-10-CM

## 2022-08-24 LAB — ECHOCARDIOGRAM COMPLETE
Area-P 1/2: 3.17 cm2
Calc EF: 39.2 %
P 1/2 time: 348 msec
S' Lateral: 4.8 cm
Single Plane A2C EF: 39.6 %
Single Plane A4C EF: 41.1 %

## 2022-08-24 MED ORDER — EMPAGLIFLOZIN 10 MG PO TABS: 10.0000 mg | ORAL_TABLET | Freq: Every day | ORAL | 3 refills | Status: DC

## 2022-08-24 NOTE — Progress Notes (Signed)
Echocardiogram 2D Echocardiogram has been performed.  Frances Furbish 08/24/2022, 1:48 PM

## 2022-08-24 NOTE — Progress Notes (Signed)
[facility]  NOTE  Referring Physician: Dr Acie Fredrickson  Primary Care: Dr Kenton Kingfisher  Primary Cardiologist: Dr Acie Fredrickson   HPI:  Jenna Stewart  is a 72 year old with systolic heart failure, breast cancer (treated with chemo adriamycin 1994 and radiation), and hyperlipidmia.    Back in the fall she noticed a cough with shortness of breath. Due to progressive dyspnea + orthophnea. PCP started antibiotics.   Echo 4/23 EF 25-30%  She was referred to Dr Acie Fredrickson. He started GDMT.  BB and ARNi  stopped due to hypotension. SBP at home 90s .  11/2021 Echo EF 20-25% RV mildly reduced.    [facility] 7/27 showed normal cors, severe NICM EF 30% with well compensated hemodynamics Ao = 110/51 (76) LV = 104/4 RA = 1 RV = 23/1 PA = 15/4 (10) PCW = 1 Fick cardiac output/index = 6.4/3.77 Thermo CO/CI = 4.7/2.8 PVR = 1.4 WU FA sat = 93% PA sat = 74%, 75% SVC sat = 81%  cMRI 8/23: EF 37% RVEF 47% Moderate MR  Here for f/u with her husband. At last visit valsartan switched to losartan 12.5 due to low BP. Feels great. No CP or SOB. Active. No edema orthopnea or PND. SBP tends to runs in 90s in AM and occasionally 80s. SBP 100-110 the rest of there day. Compliant with meds  Echo today 08/24/22 EF 30-35%    Past Medical History:  Diagnosis Date   Anxiety    Breast cancer    Cancer    History of colonic polyps    Hyperlipemia    Osteopenia     Current Outpatient Medications  Medication Sig Dispense Refill   calcium carbonate 1250 MG capsule Take 1,250 mg by mouth 2 (two) times daily with a meal.     cholecalciferol (VITAMIN D) 25 MCG (1000 UNIT) tablet Take 1,000 Units by mouth daily.     empagliflozin (JARDIANCE) 10 MG TABS tablet Take 1 tablet (10 mg total) by mouth daily before breakfast. 90 tablet 3   FLUoxetine (PROZAC) 20 MG capsule Take 20 mg by mouth daily.     losartan (COZAAR) 25 MG tablet Take 0.5 tablets (12.5 mg total) by mouth at bedtime. 45 tablet 3   metoprolol succinate (TOPROL  XL) 25 MG 24 hr tablet Take 1 tablet (25 mg total) by mouth at bedtime. 30 tablet 11   Multiple Vitamin (MULTIVITAMIN) tablet Take 1 tablet by mouth daily.     raloxifene (EVISTA) 60 MG tablet Take 60 mg by mouth daily.     spironolactone (ALDACTONE) 25 MG tablet Take 12.5 mg by mouth daily.     No current facility-administered medications for this encounter.    No Known Allergies    Social History   Socioeconomic History   Marital status: Married    Spouse name: Not on file   Number of children: Not on file   Years of education: Not on file   Highest education level: Not on file  Occupational History   Not on file  Tobacco Use   Smoking status: Never   Smokeless tobacco: Not on file  Substance and Sexual Activity   Alcohol use: Never   Drug use: Not on file   Sexual activity: Not on file  Other Topics Concern   Not on file  Social History Narrative   Not on file   Social Determinants of Health   Financial Resource Strain: Not on file  Food Insecurity: Not on file  Transportation  Needs: Not on file  Physical Activity: Not on file  Stress: Not on file  Social Connections: Not on file  Intimate Partner Violence: Not on file      Family History  Problem Relation Age of Onset   Ulcerative colitis Father    Hypertension Sister    Diabetes Sister    Uterine cancer Maternal Grandmother     Vitals:   08/24/22 1354  BP: (!) 110/58  Pulse: 82  SpO2: 98%  Weight: 62.2 kg (137 lb 3.2 oz)    PHYSICAL EXAM: General:  Well appearing. No resp difficulty HEENT: normal Neck: supple. no JVD. Carotids 2+ bilat; no bruits. No lymphadenopathy or thryomegaly appreciated. Cor: PMI nondisplaced. Regular rate & rhythm. No rubs, gallops or murmurs. Lungs: clear Abdomen: soft, nontender, nondistended. No hepatosplenomegaly. No bruits or masses. Good bowel sounds. Extremities: no cyanosis, clubbing, rash, edema Neuro: alert & orientedx3, cranial nerves grossly intact. moves  all 4 extremities w/o difficulty. Affect pleasant   ASSESSMENT & PLAN:  1. 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 No further details available  - Echo EF 20-25%.  - cMRI 8/23: EF 37% RVEF 47% Moderate MR Suspect delayed adriamycin toxicity.  - Cath 7/23 no CAD - Echo today 08/24/22 EF 30-35%  - Improved NYHA I. Volume ok  - Continue losartan mg qhs - Continue jardiance 10 mg daily  - Continue spironolactone 12.5mg  but move to qhs - Continue Toprol XL 25 qhs - Low BP prohibits further GDMT titration - With EF > 35% on MRI and NYHA I will defer ICD for now. Reconsdier as symptoms/EF dictate   2. Breast Cancer - S/P L mastectomy - Treated with adriamycin 1994   Glori Bickers MD 2:20 PM'

## 2022-08-24 NOTE — Patient Instructions (Signed)
There has bee no changes to your medications  Your physician recommends that you schedule a follow-up appointment in: 6 months ( October) ** please call the office in July to arrange your follow up appointment. **  If you have any questions or concerns before your next appointment please send [place] a message through [place] or call our office at [phone].    TO LEAVE A MESSAGE FOR THE NURSE SELECT OPTION 2, PLEASE LEAVE A MESSAGE INCLUDING: YOUR NAME DATE OF BIRTH CALL BACK NUMBER REASON FOR CALL**this is important as we prioritize the call backs  YOU WILL RECEIVE A CALL BACK THE SAME DAY AS LONG AS YOU CALL BEFORE 4:00 PM  At the [facility], you and your health needs are our priority. As part of our continuing mission to provide you with exceptional heart care, we have created designated Provider Care Teams. These Care Teams include your primary Cardiologist (physician) and Advanced Practice Providers (APPs- Physician Assistants and Nurse Practitioners) who all work together to provide you with the care you need, when you need it.   You may see any of the following providers on your designated Care Team at your next follow up: Dr Glori Bickers Dr Loralie Champagne Dr. Roxana Hires, NP Lyda Jester, [place] [facility] [place], [place] Forestine Na, NP Audry Riles, PharmD   Please be sure to bring in all your medications bottles to every appointment.    Thank you for choosing [facility]

## 2022-08-25 DIAGNOSIS — L239 Allergic contact dermatitis, unspecified cause: Secondary | ICD-10-CM | POA: Diagnosis not present

## 2022-08-25 DIAGNOSIS — L814 Other melanin hyperpigmentation: Secondary | ICD-10-CM | POA: Diagnosis not present

## 2022-08-25 DIAGNOSIS — D225 Melanocytic nevi of trunk: Secondary | ICD-10-CM | POA: Diagnosis not present

## 2022-08-25 DIAGNOSIS — L821 Other seborrheic keratosis: Secondary | ICD-10-CM | POA: Diagnosis not present

## 2022-08-26 ENCOUNTER — Encounter (HOSPITAL_BASED_OUTPATIENT_CLINIC_OR_DEPARTMENT_OTHER): Payer: Self-pay | Admitting: Emergency Medicine

## 2022-08-26 ENCOUNTER — Ambulatory Visit
Admission: EM | Admit: 2022-08-26 | Discharge: 2022-08-26 | Disposition: A | Discharge status: Home / Self Care | Payer: Medicare PPO | Attending: Internal Medicine | Admitting: Internal Medicine

## 2022-08-26 ENCOUNTER — Emergency Department (HOSPITAL_BASED_OUTPATIENT_CLINIC_OR_DEPARTMENT_OTHER)
Admission: EM | Admit: 2022-08-26 | Discharge: 2022-08-26 | Disposition: A | Discharge status: Home / Self Care | Payer: Medicare PPO | Attending: Emergency Medicine | Admitting: Emergency Medicine

## 2022-08-26 ENCOUNTER — Other Ambulatory Visit: Payer: Self-pay

## 2022-08-26 DIAGNOSIS — Z853 Personal history of malignant neoplasm of breast: Secondary | ICD-10-CM | POA: Diagnosis not present

## 2022-08-26 DIAGNOSIS — S01112A Laceration without foreign body of left eyelid and periocular area, initial encounter: Secondary | ICD-10-CM

## 2022-08-26 DIAGNOSIS — S80219A Abrasion, unspecified knee, initial encounter: Secondary | ICD-10-CM | POA: Diagnosis not present

## 2022-08-26 DIAGNOSIS — S90414A Abrasion, right lesser toe(s), initial encounter: Secondary | ICD-10-CM

## 2022-08-26 DIAGNOSIS — S0990XA Unspecified injury of head, initial encounter: Secondary | ICD-10-CM | POA: Diagnosis present

## 2022-08-26 DIAGNOSIS — W19XXXA Unspecified fall, initial encounter: Secondary | ICD-10-CM | POA: Diagnosis not present

## 2022-08-26 DIAGNOSIS — W108XXA Fall (on) (from) other stairs and steps, initial encounter: Secondary | ICD-10-CM | POA: Insufficient documentation

## 2022-08-26 DIAGNOSIS — S01419A Laceration without foreign body of unspecified cheek and temporomandibular area, initial encounter: Secondary | ICD-10-CM | POA: Insufficient documentation

## 2022-08-26 DIAGNOSIS — S80211A Abrasion, right knee, initial encounter: Secondary | ICD-10-CM | POA: Diagnosis not present

## 2022-08-26 DIAGNOSIS — S0181XA Laceration without foreign body of other part of head, initial encounter: Secondary | ICD-10-CM

## 2022-08-26 DIAGNOSIS — Z23 Encounter for immunization: Secondary | ICD-10-CM | POA: Diagnosis not present

## 2022-08-26 HISTORY — DX: Cardiomyopathy, unspecified: I42.9

## 2022-08-26 MED ORDER — LIDOCAINE-EPINEPHRINE-TETRACAINE (LET) TOPICAL GEL
3.0000 mL | Freq: Once | TOPICAL | Status: AC
  Administered 2022-08-26: 3 mL via TOPICAL
  Filled 2022-08-26: qty 3

## 2022-08-26 MED ORDER — TETANUS-DIPHTH-ACELL PERTUSSIS 5-2.5-18.5 LF-MCG/0.5 IM SUSY
0.5000 mL | PREFILLED_SYRINGE | Freq: Once | INTRAMUSCULAR | Status: AC
  Administered 2022-08-26: 0.5 mL via INTRAMUSCULAR
  Filled 2022-08-26: qty 0.5

## 2022-08-26 NOTE — ED Provider Notes (Signed)
[facility] Provider Note   CSN: [id number] Arrival date & time: 08/26/22  1458     History  Chief Complaint  Patient presents with   Facial Laceration    Jenna Stewart is a 72 y.o. female.  HPI   Patient has a history of breast cancer, hyperlipidemia, cardiomyopathy.  She is not currently on anticoagulation.  Patient states she was walking down a step when she stumbled.  She excellently fell forward striking her head and scraping her knee.  Patient did not lose consciousness.  She is not having a severe headache.  She denies any blurred vision.  Patient went to an urgent care who felt that she may need imaging so she was sent to the ED for further evaluation.  Home Medications Prior to Admission medications   Medication Sig Start Date End Date Taking? Authorizing Provider  calcium carbonate 1250 MG capsule Take 1,250 mg by mouth 2 (two) times daily with a meal.    [provider]  cholecalciferol (VITAMIN D) 25 MCG (1000 UNIT) tablet Take 1,000 Units by mouth daily.    [provider]  empagliflozin (JARDIANCE) 10 MG TABS tablet Take 1 tablet (10 mg total) by mouth daily before breakfast. 08/24/22   Bensimhon, Shaune Pascal, MD  FLUoxetine (PROZAC) 20 MG capsule Take 20 mg by mouth daily. 06/18/13   [provider]  losartan (COZAAR) 25 MG tablet Take 0.5 tablets (12.5 mg total) by mouth at bedtime. 04/12/22   Bensimhon, Shaune Pascal, MD  metoprolol succinate (TOPROL XL) 25 MG 24 hr tablet Take 1 tablet (25 mg total) by mouth at bedtime. 06/08/22   Bensimhon, Shaune Pascal, MD  Multiple Vitamin (MULTIVITAMIN) tablet Take 1 tablet by mouth daily.    [provider]  raloxifene (EVISTA) 60 MG tablet Take 60 mg by mouth daily.    [provider]  spironolactone (ALDACTONE) 25 MG tablet Take 12.5 mg by mouth daily.    [provider]      Allergies    Patient has no known allergies.    Review of  Systems   Review of Systems  Physical Exam Updated Vital Signs BP (!) 131/58   Pulse 74   Temp 98.3 F (36.8 C) (Oral)   Resp 16   SpO2 98%  Physical Exam Vitals and nursing note reviewed.  Constitutional:      General: She is not in acute distress.    Appearance: She is well-developed.  HENT:     Head: Normocephalic.     Comments: Approximately 1 cm laceration lateral to the left eye, bleeding stopped, although some bleeding returned after wound irrigation    Right Ear: External ear normal.     Left Ear: External ear normal.  Eyes:     General: No scleral icterus.       Right eye: No discharge.        Left eye: No discharge.     Conjunctiva/sclera: Conjunctivae normal.  Neck:     Trachea: No tracheal deviation.  Cardiovascular:     Rate and Rhythm: Normal rate.  Pulmonary:     Effort: Pulmonary effort is normal. No respiratory distress.     Breath sounds: No stridor.  Abdominal:     General: There is no distension.  Musculoskeletal:        General: No swelling or deformity.     Cervical back: Neck supple.     Comments: No tenderness to palpation in  the extremities, no cervical  Skin:    General: Skin is warm and dry.     Findings: No rash.  Neurological:     General: No focal deficit present.     Mental Status: She is alert and oriented to person, place, and time. Mental status is at baseline.     Cranial Nerves: No dysarthria or facial asymmetry.     Sensory: No sensory deficit.     Motor: No weakness or seizure activity.     Gait: Gait normal.     ED Results / Procedures / Treatments   Labs (all labs ordered are listed, but only abnormal results are displayed) Labs Reviewed - No data to display  EKG None  Radiology No results found.  Procedures .Marland KitchenLaceration Repair  Date/Time: 08/26/2022 4:50 PM  Performed by: Dorie Rank, MD Authorized by: Dorie Rank, MD   Consent:    Consent obtained:  Verbal   Consent given by:  Patient   Risks, benefits, and  alternatives were discussed: yes     Risks discussed:  Infection, need for additional repair, pain and poor cosmetic result   Alternatives discussed:  No treatment Universal protocol:    Procedure explained and questions answered to patient or proxy's satisfaction: yes     Patient identity confirmed:  Verbally with patient Anesthesia:    Anesthesia method:  Topical application   Topical anesthetic:  LET Laceration details:    Length (cm):  1 Exploration:    Wound exploration: entire depth of wound visualized     Wound extent: no foreign body and no underlying fracture     Contaminated: no   Treatment:    Area cleansed with:  Shur-Clens   Amount of cleaning:  Standard   Irrigation method:  Pressure wash Skin repair:    Repair method:  Tissue adhesive Approximation:    Approximation:  Close Repair type:    Repair type:  Simple Post-procedure details:    Dressing:  Sterile dressing   Procedure completion:  Tolerated well, no immediate complications     Medications Ordered in ED Medications  lidocaine-EPINEPHrine-tetracaine (LET) topical gel (3 mLs Topical Given 08/26/22 1543)    ED Course/ Medical Decision Making/ A&P                             Medical Decision Making Problems Addressed: Facial laceration, initial encounter: acute illness or injury Injury of head, initial encounter: acute illness or injury   Patient was seen in urgent care after tripping and falling.  Patient sustained a knee abrasion as well as a laceration to her face.  There was concerned about possible need for advanced imaging.  Per [place] CT scan rules cannot exclude the need for CT scan based on her age.  Discussed options with the patient.  Overall I have low suspicion for serious head injury.  She did not have loss of consciousness.  She does not have a headache.  She does not have any facial deformity or tenderness and I do not have any suspicion for significant facial bone fracture.  I do not  feel that advanced imaging is necessary at this time but explained to the patient that it certainly is a consideration and we could proceed with CT scan while she is here.  The alternative would be to monitor for any signs of headache confusion vomiting or other clinical suspicion.  Patient states she can return to the ED easily.  She is comfortable with not proceeding with any CT scan imaging at this time.          Final Clinical Impression(s) / ED Diagnoses Final diagnoses:  Facial laceration, initial encounter  Injury of head, initial encounter    Rx / DC Orders ED Discharge Orders     None         Dorie Rank, MD 08/26/22 1652

## 2022-08-26 NOTE — Discharge Instructions (Signed)
The skin glue will eventually peel away.  Try to keep it dry.  Do not put any antibiotic ointment on the glue as it will.  Return to the ED if you start having severe headaches, confusion, vomiting or other concerning symptoms.

## 2022-08-26 NOTE — Discharge Instructions (Signed)
Please go to the emergency department for further evaluation and management. 

## 2022-08-26 NOTE — ED Triage Notes (Signed)
Missed step, trip and fall.    Laceration to left brow, abrasions to right hand,right knee and right 2nd toe. Bleeding controlled pt denies loc.      Sent from [facility] for stitches  Approx 3cm

## 2022-08-26 NOTE — ED Notes (Signed)
Non-stick dressing applied. Given supply for re-dressing at home.

## 2022-08-26 NOTE — ED Provider Notes (Addendum)
[facility]    CSN: [id number] Arrival date & time: 08/26/22  1408      History   Chief Complaint Chief Complaint  Patient presents with   Laceration    HPI Jenna Stewart is a 72 y.o. female.   Patient presents for further evaluation after a fall that occurred about 1 to 2 hours prior to arrival.  Patient reports that she tripped off of the cement at her garage falling forward.  She has a laceration to the left side of her head as she did hit her head.  She denies loss of consciousness and does not take any blood thinning medications.  She also has an abrasion to the right knee and right second toe.  Patient reports that her tetanus vaccine was over 5 years ago but she is not sure of the exact date.  Patient denies headache other than where laceration is present.  Patient denies dizziness, blurred vision, nausea, vomiting.   Laceration   Past Medical History:  Diagnosis Date   Anxiety    Breast cancer    Cancer    History of colonic polyps    Hyperlipemia    Osteopenia     There are no problems to display for this patient.   Past Surgical History:  Procedure Laterality Date   MASTECTOMY     RIGHT/LEFT HEART CATH AND CORONARY ANGIOGRAPHY N/A 12/17/2021   Procedure: RIGHT/LEFT HEART CATH AND CORONARY ANGIOGRAPHY;  Surgeon: Jolaine Artist, MD;  Location: [facility];  Service: Cardiovascular;  Laterality: N/A;    OB History   No obstetric history on file.      Home Medications    Prior to Admission medications   Medication Sig Start Date End Date Taking? Authorizing Provider  calcium carbonate 1250 MG capsule Take 1,250 mg by mouth 2 (two) times daily with a meal.    [provider]  cholecalciferol (VITAMIN D) 25 MCG (1000 UNIT) tablet Take 1,000 Units by mouth daily.    [provider]  empagliflozin (JARDIANCE) 10 MG TABS tablet Take 1 tablet (10 mg total) by mouth daily before breakfast. 08/24/22   Bensimhon, Shaune Pascal, MD  FLUoxetine (PROZAC) 20 MG capsule Take 20 mg by mouth daily. 06/18/13   [provider]  losartan (COZAAR) 25 MG tablet Take 0.5 tablets (12.5 mg total) by mouth at bedtime. 04/12/22   Bensimhon, Shaune Pascal, MD  metoprolol succinate (TOPROL XL) 25 MG 24 hr tablet Take 1 tablet (25 mg total) by mouth at bedtime. 06/08/22   Bensimhon, Shaune Pascal, MD  Multiple Vitamin (MULTIVITAMIN) tablet Take 1 tablet by mouth daily.    [provider]  raloxifene (EVISTA) 60 MG tablet Take 60 mg by mouth daily.    [provider]  spironolactone (ALDACTONE) 25 MG tablet Take 12.5 mg by mouth daily.    [provider]    Family History Family History  Problem Relation Age of Onset   Ulcerative colitis Father    Hypertension Sister    Diabetes Sister    Uterine cancer Maternal Grandmother     Social History Social History   Tobacco Use   Smoking status: Never  Substance Use Topics   Alcohol use: Never     Allergies   Patient has no known allergies.   Review of Systems Review of Systems Per HPI  Physical Exam Triage Vital Signs ED Triage Vitals [08/26/22 1419]  Enc Vitals Group     BP Marland Kitchen)  108/55     Pulse Rate 76     Resp 16     Temp (!) 97.3 F (36.3 C)     Temp Source Oral     SpO2 99 %     Weight      Height      Head Circumference      Peak Flow      Pain Score 3     Pain Loc      Pain Edu?      Excl. in [place]?    No data found.  Updated Vital Signs BP (!) 108/55 (BP Location: Right Arm)   Pulse 76   Temp (!) 97.3 F (36.3 C) (Oral)   Resp 16   SpO2 99%   Visual Acuity Right Eye Distance:   Left Eye Distance:   Bilateral Distance:    Right Eye Near:   Left Eye Near:    Bilateral Near:     Physical Exam Constitutional:      General: She is not in acute distress.    Appearance: Normal appearance. She is not toxic-appearing or diaphoretic.  HENT:     Head: Normocephalic and atraumatic.      Comments: Patient has  approximately 1.5 cm linear laceration present to left lateral head directly above left eyebrow.  Bleeding controlled.  No obvious foreign bodies noted.  Patient has bruising discoloration and minimal swelling present to the left cheekbone that extends down from laceration of face.  Tenderness to palpation to this area. Eyes:     Extraocular Movements: Extraocular movements intact.     Conjunctiva/sclera: Conjunctivae normal.  Pulmonary:     Effort: Pulmonary effort is normal.  Feet:     Comments: Patient has very minimal small abrasion with bleeding controlled present to the distal pad of the right second toe.  No laceration noted.  Nail appears intact.  Full range of motion of toe present.  Capillary refill and pulses intact. Skin:    Comments: Patient has very superficial abrasion with bleeding controlled present to right anterior knee.  No laceration present.  Neurological:     General: No focal deficit present.     Mental Status: She is alert and oriented to person, place, and time. Mental status is at baseline.     Cranial Nerves: Cranial nerves 2-12 are intact.     Sensory: Sensation is intact.     Motor: Motor function is intact.     Coordination: Coordination is intact.     Gait: Gait is intact.  Psychiatric:        Mood and Affect: Mood normal.        Behavior: Behavior normal.        Thought Content: Thought content normal.        Judgment: Judgment normal.      UC Treatments / Results  Labs (all labs ordered are listed, but only abnormal results are displayed) Labs Reviewed - No data to display  EKG   Radiology No results found.  Procedures Procedures (including critical care time)  Medications Ordered in UC Medications - No data to display  Initial Impression / Assessment and Plan / UC Course  I have reviewed the triage vital signs and the nursing notes.  Pertinent labs & imaging results that were available during my care of the patient were reviewed by me  and considered in my medical decision making (see chart for details).     I am concerned the patient has  head injury with associated swelling, discoloration, tenderness to left facial bones.  I do think that imaging of the face/head is necessary due to trauma which cannot provided here at urgent care so patient was advised to go to the ER for further evaluation and management and suture repair of the laceration to left face.  Patient was agreeable with this plan.  Neuroexam appears normal so agree with patient self transport.  Nonadherent dressings applied by clinical staff to laceration and abrasion to right knee.  Patient will need tetanus vaccine to be updated which is deferred to ER with additional evaluation and management.  Patient left via self transport. Final Clinical Impressions(s) / UC Diagnoses   Final diagnoses:  Laceration of eyebrow and forehead, left, initial encounter  Fall, initial encounter  Abrasion of right knee, initial encounter  Toe abrasion, right, initial encounter     Discharge Instructions      Please go to the emergency department for further evaluation and management.    ED Prescriptions   None    PDMP not reviewed this encounter.   Teodora Medici, [place] 08/26/22 1436    Teodora Medici, [place] 08/26/22 1438

## 2022-08-26 NOTE — ED Triage Notes (Signed)
Pt states she tripped and fell.  Laceration to left brow, abrasions to right hand,right knee and right 2nd toe. Bleeding controlled pt denies loc.

## 2022-09-15 ENCOUNTER — Other Ambulatory Visit (HOSPITAL_COMMUNITY): Payer: Self-pay

## 2022-09-15 DIAGNOSIS — I5041 Acute combined systolic (congestive) and diastolic (congestive) heart failure: Secondary | ICD-10-CM

## 2022-09-15 DIAGNOSIS — R000 Tachycardia, unspecified: Secondary | ICD-10-CM

## 2022-09-15 MED ORDER — EMPAGLIFLOZIN 10 MG PO TABS
10.0000 mg | ORAL_TABLET | Freq: Every day | ORAL | 3 refills | Status: DC
Start: 2022-09-15 — End: 2023-09-13

## 2022-09-21 DIAGNOSIS — M8588 Other specified disorders of bone density and structure, other site: Secondary | ICD-10-CM | POA: Diagnosis not present

## 2022-09-21 DIAGNOSIS — E7800 Pure hypercholesterolemia, unspecified: Secondary | ICD-10-CM | POA: Diagnosis not present

## 2022-09-21 DIAGNOSIS — H04129 Dry eye syndrome of unspecified lacrimal gland: Secondary | ICD-10-CM | POA: Diagnosis not present

## 2022-09-21 DIAGNOSIS — Z0000 Encounter for general adult medical examination without abnormal findings: Secondary | ICD-10-CM | POA: Diagnosis not present

## 2022-09-21 DIAGNOSIS — F419 Anxiety disorder, unspecified: Secondary | ICD-10-CM | POA: Diagnosis not present

## 2022-09-21 DIAGNOSIS — I5022 Chronic systolic (congestive) heart failure: Secondary | ICD-10-CM | POA: Diagnosis not present

## 2022-09-21 DIAGNOSIS — Z853 Personal history of malignant neoplasm of breast: Secondary | ICD-10-CM | POA: Diagnosis not present

## 2022-10-05 DIAGNOSIS — Z1231 Encounter for screening mammogram for malignant neoplasm of breast: Secondary | ICD-10-CM | POA: Diagnosis not present

## 2022-10-05 DIAGNOSIS — N958 Other specified menopausal and perimenopausal disorders: Secondary | ICD-10-CM | POA: Diagnosis not present

## 2022-10-12 DIAGNOSIS — R922 Inconclusive mammogram: Secondary | ICD-10-CM | POA: Diagnosis not present

## 2022-10-25 ENCOUNTER — Other Ambulatory Visit (HOSPITAL_COMMUNITY): Payer: Self-pay | Admitting: Cardiology

## 2022-10-25 MED ORDER — SPIRONOLACTONE 25 MG PO TABS: 12.5000 mg | ORAL_TABLET | Freq: Every day | ORAL | 3 refills | Status: DC

## 2022-11-01 DIAGNOSIS — H16223 Keratoconjunctivitis sicca, not specified as Sjogren's, bilateral: Secondary | ICD-10-CM | POA: Diagnosis not present

## 2022-11-01 DIAGNOSIS — H04123 Dry eye syndrome of bilateral lacrimal glands: Secondary | ICD-10-CM | POA: Diagnosis not present

## 2022-12-17 ENCOUNTER — Telehealth: Payer: Self-pay | Admitting: Physician Assistant

## 2022-12-17 ENCOUNTER — Other Ambulatory Visit: Payer: Self-pay | Admitting: Physician Assistant

## 2022-12-17 MED ORDER — MOLNUPIRAVIR 200 MG PO CAPS: 4.0000 | ORAL_CAPSULE | Freq: Two times a day (BID) | ORAL | 0 refills | Status: AC

## 2022-12-17 NOTE — Telephone Encounter (Signed)
Patient paged after hour answering service regarding stuffed nose and sore throat.  Symptoms started 2 days after her husband was diagnosed with COVID.  She is being followed by Dr. Gala Romney of heart failure service.  Based on her symptom, I sent in a prescription for molnupiravir 800mg  BID for 5 days. If cost is too high, she has been  instructed to contact us and we can potentially switch to Paxlovid which has a little more interaction with cardiac medications.

## 2023-01-04 IMAGING — CR DG CHEST 2V
2 series · 2 of 2 positions shown · non-contrast
Comparison: None.

CLINICAL DATA: Shortness of breath, bronchitis, cough.

EXAM:
CHEST - 2 VIEW

[w chest pa]
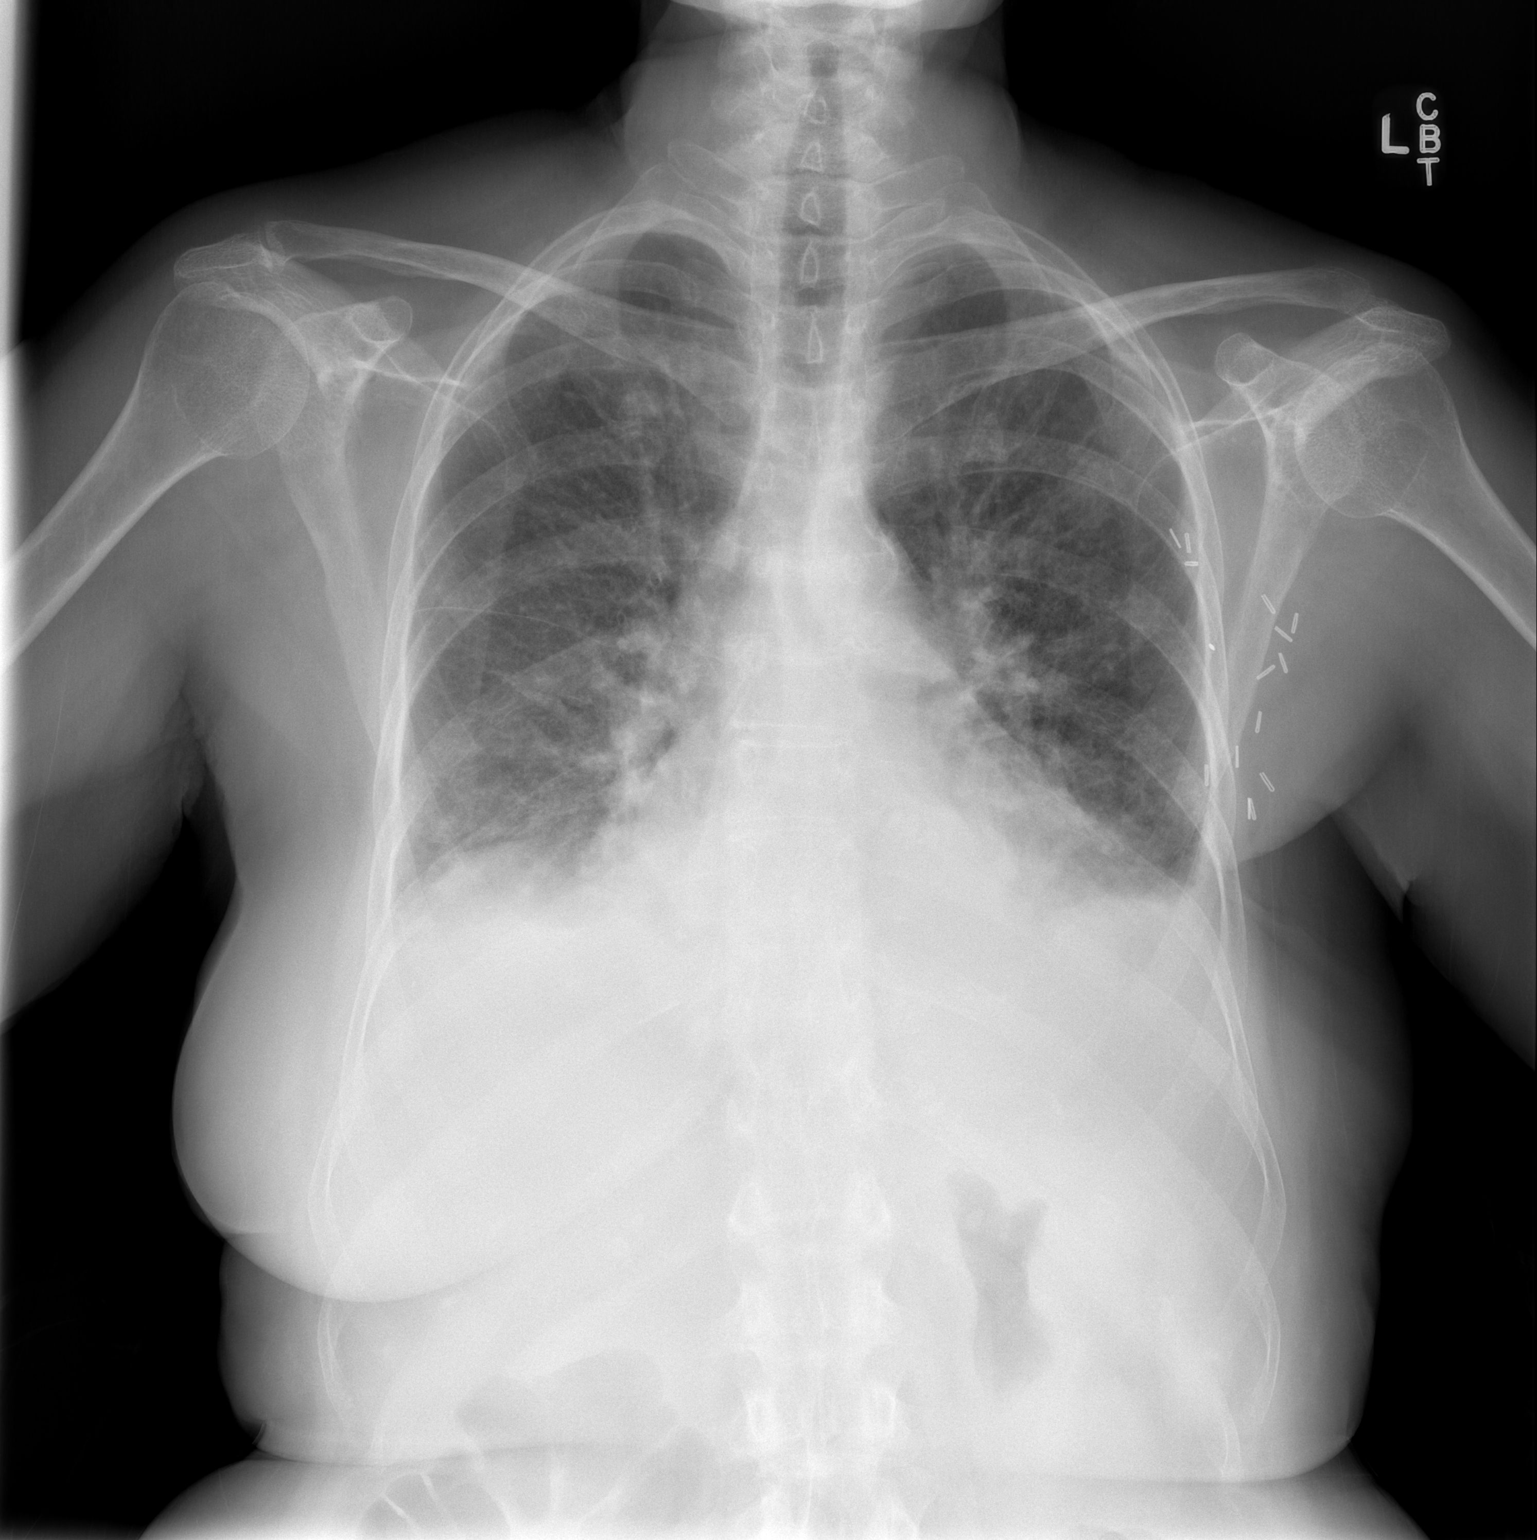

[w chest lat]
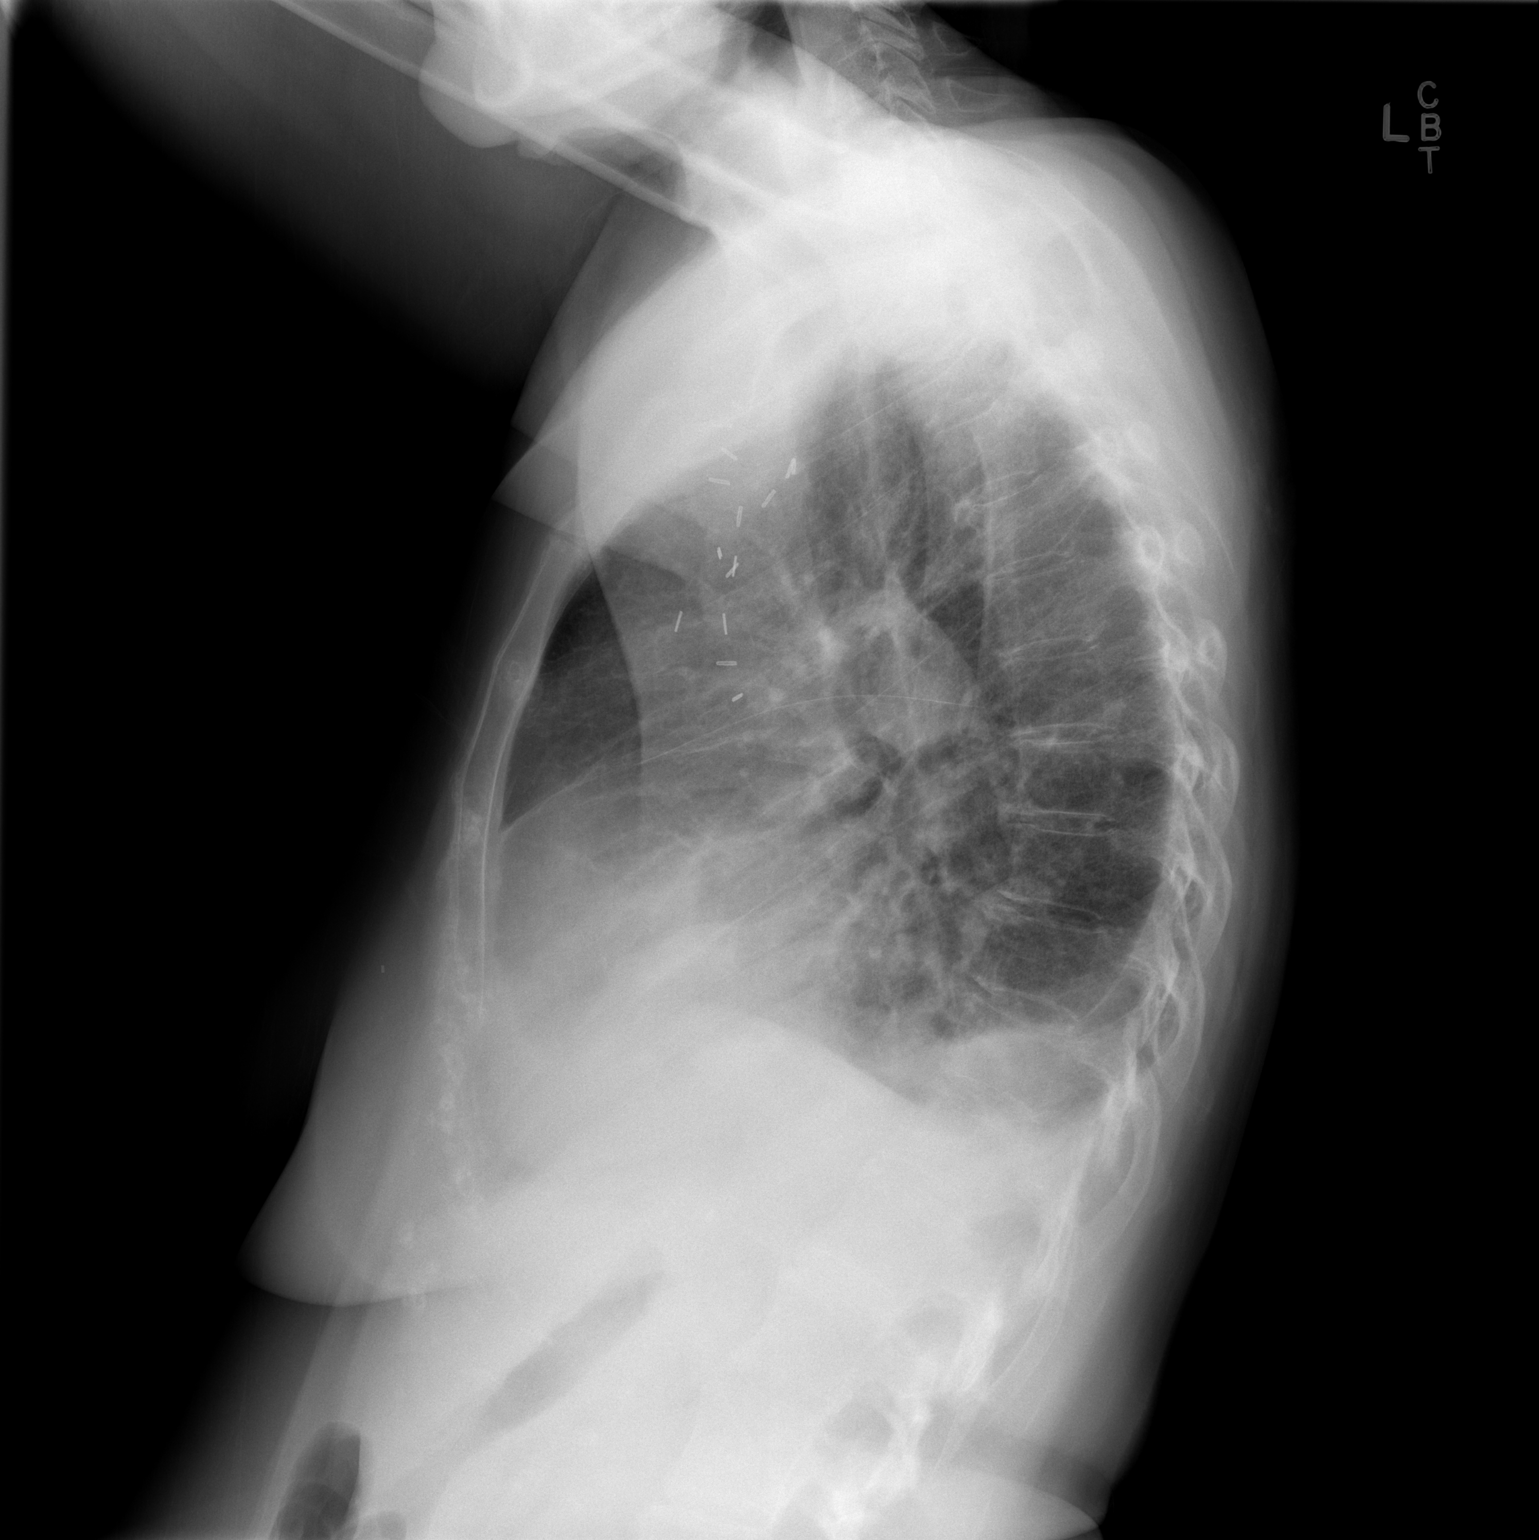

[2 of 2 positions shown; findings below may reference images not displayed]

FINDINGS: There are patchy bibasilar infiltrates with small pleural effusions.
There central interstitial markings bilaterally. Cardiac silhouette
appears mildly enlarged. No pneumothorax or acute fracture. Left
axillary surgical clips are present.
IMPRESSION: 1. Cardiomegaly with central pulmonary vascular congestion
2. Bibasilar infiltrates may represent edema and or infection.
3. Small pleural effusions.

## 2023-03-24 ENCOUNTER — Encounter (HOSPITAL_COMMUNITY): Payer: Self-pay | Admitting: Internal Medicine

## 2023-03-24 ENCOUNTER — Ambulatory Visit (HOSPITAL_COMMUNITY)
Admission: RE | Admit: 2023-03-24 | Discharge: 2023-03-24 | Disposition: A | Discharge status: Home / Self Care | Payer: Medicare PPO | Source: Ambulatory Visit | Attending: Internal Medicine | Admitting: Internal Medicine

## 2023-03-24 VITALS — BP 122/70 | HR 78 | Wt 139.0 lb

## 2023-03-24 DIAGNOSIS — I428 Other cardiomyopathies: Secondary | ICD-10-CM | POA: Diagnosis not present

## 2023-03-24 DIAGNOSIS — E785 Hyperlipidemia, unspecified: Secondary | ICD-10-CM | POA: Diagnosis not present

## 2023-03-24 DIAGNOSIS — Z9012 Acquired absence of left breast and nipple: Secondary | ICD-10-CM | POA: Insufficient documentation

## 2023-03-24 DIAGNOSIS — Z7984 Long term (current) use of oral hypoglycemic drugs: Secondary | ICD-10-CM | POA: Diagnosis not present

## 2023-03-24 DIAGNOSIS — Z853 Personal history of malignant neoplasm of breast: Secondary | ICD-10-CM | POA: Diagnosis not present

## 2023-03-24 DIAGNOSIS — Z79899 Other long term (current) drug therapy: Secondary | ICD-10-CM | POA: Diagnosis not present

## 2023-03-24 DIAGNOSIS — I5022 Chronic systolic (congestive) heart failure: Secondary | ICD-10-CM | POA: Diagnosis not present

## 2023-03-24 LAB — BASIC METABOLIC PANEL
Anion gap: 10 (ref 5–15)
BUN: 17 mg/dL (ref 8–23)
CO2: 23 mmol/L (ref 22–32)
Calcium: 9.7 mg/dL (ref 8.9–10.3)
Chloride: 105 mmol/L (ref 98–111)
Creatinine, Ser: 0.86 mg/dL (ref 0.44–1.00)
GFR, Estimated: >60 mL/min (ref >60)
Glucose, Bld: 82 mg/dL (ref 70–99)
Potassium: 4.1 mmol/L (ref 3.5–5.1)
Sodium: 138 mmol/L (ref 135–145)

## 2023-03-24 LAB — BRAIN NATRIURETIC PEPTIDE: B Natriuretic Peptide: 63.5 pg/mL (ref 0.0–100.0)

## 2023-03-24 NOTE — Addendum Note (Signed)
Encounter addended by: Linda Hedges, RN on: 03/24/2023 9:24 AM  Actions taken: Order list changed, Diagnosis association updated, Clinical Note Signed, Charge Capture section accepted

## 2023-03-24 NOTE — Patient Instructions (Addendum)
There has been no changes to your medications    Labs done today, your results will be available in MyChart, we will contact you for abnormal readings.  Your physician has requested that you have an echocardiogram. Echocardiography is a painless test that uses sound waves to create images of your heart. It provides your doctor with information about the size and shape of your heart and how well your heart's chambers and valves are working. This procedure takes approximately one hour. There are no restrictions for this procedure. Please do NOT wear cologne, perfume, aftershave, or lotions (deodorant is allowed). Please arrive 15 minutes prior to your appointment time.  Your physician recommends that you schedule a follow-up appointment in: 6 months ( April 2025) ** PLEASE CALL THE OFFICE IN [place] TO ARRANGE YOUR FOLLOW UP APPOINTMENT. **  If you have any questions or concerns before your next appointment please send [place] a message through [place] or call our office at [phone].    TO LEAVE A MESSAGE FOR THE NURSE SELECT OPTION 2, PLEASE LEAVE A MESSAGE INCLUDING: YOUR NAME DATE OF BIRTH CALL BACK NUMBER REASON FOR CALL**this is important as we prioritize the call backs  YOU WILL RECEIVE A CALL BACK THE SAME DAY AS LONG AS YOU CALL BEFORE 4:00 PM  At the [facility], you and your health needs are our priority. As part of our continuing mission to provide you with exceptional heart care, we have created designated Provider Care Teams. These Care Teams include your primary Cardiologist (physician) and Advanced Practice Providers (APPs- Physician Assistants and Nurse Practitioners) who all work together to provide you with the care you need, when you need it.   You may see any of the following providers on your designated Care Team at your next follow up: Dr Arvilla Meres Dr Marca Ancona Dr. Dorthula Nettles Dr. Clearnce Hasten Amy Filbert Schilder, NP Robbie Lis,  [place] [facility] [place], [place] Brynda Peon, NP [place] Lee, NP Karle Plumber, PharmD   Please be sure to bring in all your medications bottles to every appointment.    Thank you for choosing [facility]

## 2023-03-24 NOTE — Progress Notes (Signed)
ADVANCED HF CLINIC  NOTE  Referring Physician: Dr Elease Hashimoto  Primary Care: Dr Tiburcio Pea  Primary Cardiologist: Dr Elease Hashimoto   HPI:  Jenna Stewart  is a 72 year old with systolic heart failure, breast cancer (treated with chemo adriamycin 1994 and radiation), and hyperlipidmia.   Back in the fall she noticed a cough with shortness of breath. Due to progressive dyspnea + orthophnea. PCP started antibiotics.   Echo 4/23 EF 25-30%  She was referred to Dr Elease Hashimoto. He started GDMT.  BB and ARNi  stopped due to hypotension. SBP at home 90s .  11/2021 Echo EF 20-25% RV mildly reduced.    [facility] 7/27 showed normal cors, severe NICM EF 30% with well compensated hemodynamics Ao = 110/51 (76) LV = 104/4 RA = 1 RV = 23/1 PA = 15/4 (10) PCW = 1 Fick cardiac output/index = 6.4/3.77 Thermo CO/CI = 4.7/2.8 PVR = 1.4 WU FA sat = 93% PA sat = 74%, 75% SVC sat = 81%  cMRI 8/23: EF 37% RVEF 47% Moderate MR   Echo  08/24/22 EF 30-35%   Here for f/u. Previously valsartan switched to losartan 12.5 due to low BP. BP is better but still marginal SBP 90-110. Feels good. No dizziness. Active. [employer] leaves leaves. Cares for her mother who is on Hospice at her house. No edema, orthopnea or PND   Past Medical History:  Diagnosis Date   Anxiety    Breast cancer (HCC)    Cancer (HCC)    Cardiomyopathy (HCC)    History of colonic polyps    Hyperlipemia    Osteopenia     Current Outpatient Medications  Medication Sig Dispense Refill   calcium carbonate 1250 MG capsule Take 1,250 mg by mouth 2 (two) times daily with a meal.     cholecalciferol (VITAMIN D) 25 MCG (1000 UNIT) tablet Take 1,000 Units by mouth daily.     empagliflozin (JARDIANCE) 10 MG TABS tablet Take 1 tablet (10 mg total) by mouth daily before breakfast. 90 tablet 3   FLUoxetine (PROZAC) 20 MG capsule Take 20 mg by mouth daily.     losartan (COZAAR) 25 MG tablet Take 0.5 tablets (12.5 mg total) by mouth at bedtime. 45 tablet 3   metoprolol  succinate (TOPROL XL) 25 MG 24 hr tablet Take 1 tablet (25 mg total) by mouth at bedtime. 30 tablet 11   Multiple Vitamin (MULTIVITAMIN) tablet Take 1 tablet by mouth daily.     raloxifene (EVISTA) 60 MG tablet Take 60 mg by mouth daily.     spironolactone (ALDACTONE) 25 MG tablet Take 0.5 tablets (12.5 mg total) by mouth daily. 45 tablet 3   No current facility-administered medications for this encounter.    No Known Allergies    Social History   Socioeconomic History   Marital status: Married    Spouse name: Not on file   Number of children: Not on file   Years of education: Not on file   Highest education level: Not on file  Occupational History   Not on file  Tobacco Use   Smoking status: Never   Smokeless tobacco: Not on file  Substance and Sexual Activity   Alcohol use: Never   Drug use: Not on file   Sexual activity: Not on file  Other Topics Concern   Not on file  Social History Narrative   Not on file   Social Determinants of Health   Financial Resource Strain: Not on file  Food Insecurity:  Not on file  Transportation Needs: Not on file  Physical Activity: Not on file  Stress: Not on file  Social Connections: Not on file  Intimate Partner Violence: Not on file     Family History  Problem Relation Age of Onset   Ulcerative colitis Father    Hypertension Sister    Diabetes Sister    Uterine cancer Maternal Grandmother     Vitals:   03/24/23 0856  BP: 122/70  Pulse: 78  SpO2: 98%  Weight: 63 kg (139 lb)   PHYSICAL EXAM: General:  Well appearing. No resp difficulty HEENT: normal Neck: supple. no JVD. Carotids 2+ bilat; no bruits. No lymphadenopathy or thryomegaly appreciated. Cor: PMI nondisplaced. Regular rate & rhythm. No rubs, gallops or murmurs. Lungs: clear Abdomen: soft, nontender, nondistended. No hepatosplenomegaly. No bruits or masses. Good bowel sounds. Extremities: no cyanosis, clubbing, rash, edema Neuro: alert & orientedx3, cranial  nerves grossly intact. moves all 4 extremities w/o difficulty. Affect pleasant  ECG: NSR 69 LVH Personally reviewed   ASSESSMENT & PLAN:  1. 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 No further details available  - Echo EF 20-25%.  - cMRI 8/23: EF 37% RVEF 47% Moderate MR Suspect delayed adriamycin toxicity.  - Cath 7/23 no CAD - Echo 08/24/22 EF 30-35%  - Stable NYHA I. Volume ok  - Continue losartan 12.5 mg qhs - Continue jardiance 10 mg daily  - Continue spironolactone 12.5mg  but move to qhs - Continue Toprol XL 25 qhs - Low prohibits further GDMT titration - With EF > 35% on MRI and NYHA I will defer ICD for now. Reconsdier as symptoms/EF dictate - Will get labs and repeat echo   2. Breast Cancer - S/P L mastectomy - Treated with adriamycin 1994   Arvilla Meres MD 9:16 AM'

## 2023-03-30 ENCOUNTER — Other Ambulatory Visit (HOSPITAL_COMMUNITY): Payer: Self-pay | Admitting: Internal Medicine

## 2023-04-01 ENCOUNTER — Other Ambulatory Visit (HOSPITAL_COMMUNITY): Payer: Self-pay | Admitting: Cardiology

## 2023-04-01 MED ORDER — SPIRONOLACTONE 25 MG PO TABS: 12.5000 mg | ORAL_TABLET | Freq: Every day | ORAL | 3 refills | Status: DC

## 2023-04-01 MED ORDER — METOPROLOL SUCCINATE ER 25 MG PO TB24: 25.0000 mg | ORAL_TABLET | Freq: Every day | ORAL | 3 refills | Status: DC

## 2023-04-26 ENCOUNTER — Telehealth: Payer: Self-pay | Admitting: *Deleted

## 2023-05-05 ENCOUNTER — Ambulatory Visit (HOSPITAL_COMMUNITY)
Admission: RE | Admit: 2023-05-05 | Discharge: 2023-05-05 | Disposition: A | Discharge status: Home / Self Care | Payer: Medicare PPO | Source: Ambulatory Visit | Attending: Internal Medicine | Admitting: Internal Medicine

## 2023-05-05 ENCOUNTER — Encounter: Payer: Self-pay | Admitting: *Deleted

## 2023-05-05 DIAGNOSIS — I509 Heart failure, unspecified: Secondary | ICD-10-CM | POA: Insufficient documentation

## 2023-05-05 DIAGNOSIS — E785 Hyperlipidemia, unspecified: Secondary | ICD-10-CM | POA: Insufficient documentation

## 2023-05-05 DIAGNOSIS — I083 Combined rheumatic disorders of mitral, aortic and tricuspid valves: Secondary | ICD-10-CM | POA: Insufficient documentation

## 2023-05-05 DIAGNOSIS — I5022 Chronic systolic (congestive) heart failure: Secondary | ICD-10-CM | POA: Diagnosis not present

## 2023-05-05 DIAGNOSIS — Z006 Encounter for examination for normal comparison and control in clinical research program: Secondary | ICD-10-CM

## 2023-05-05 LAB — ECHOCARDIOGRAM COMPLETE
AR max vel: 2.13 cm2
AV Area VTI: 2.08 cm2
AV Area mean vel: 1.82 cm2
AV Mean grad: 2 mm[Hg]
AV Peak grad: 2.8 mm[Hg]
Ao pk vel: 0.84 m/s
Area-P 1/2: 2.31 cm2
Calc EF: 40 %
MV VTI: 2.06 cm2
P 1/2 time: 603 ms
S' Lateral: 4.6 cm
Single Plane A2C EF: 40.8 %
Single Plane A4C EF: 39.1 %

## 2023-05-05 NOTE — Progress Notes (Signed)
  Echocardiogram 2D Echocardiogram has been performed.  Ocie Doyne RDCS 05/05/2023, 11:47 AM

## 2023-05-05 NOTE — Research (Signed)
SITE: 050     Subject #[id number]   Subprotocol: A  Inclusion Criteria  Patients who meet all of the following criteria are eligible for enrollment as study participants:  Yes No  Age > 74 years old X   Eligible to wear Holter Study X    Exclusion Criteria  Patients who meet any of these criteria are not eligible for enrollment as study participants: Yes No  1. Receiving any mechanical (respiratory or circulatory) or renal support therapy at Screening or during Visit #1.  X  2.  Any other conditions that in the opinion of the investigators are likely to prevent compliance with the study protocol or pose a safety concern if the subject participates in the study.  X  3. Poor tolerance, namely susceptible to severe skin allergies from ECG adhesive patch application.  X   Protocol: REV H                                     Residential Zip [address] (First 3 digits ONLY)                                             [facility] Informed Consent   Subject Name: Jenna Stewart  Subject met inclusion and exclusion criteria.  The informed consent form, study requirements and expectations were reviewed with the subject. Subject had opportunity to read consent and questions and concerns were addressed prior to the signing of the consent form.  The subject verbalized understanding of the trial requirements.  The subject agreed to participate in the EF-ACT trial and signed the informed consent at 1140am on 05/05/2023.  The informed consent was obtained prior to performance of any protocol-specific procedures for the subject.  A copy of the signed informed consent was given to the subject and a copy was placed in the subject's medical record.   Jenna Stewart          Current Outpatient Medications:    calcium carbonate 1250 MG capsule, Take 1,250 mg by mouth 2 (two) times daily with a meal., Disp: , Rfl:    cholecalciferol (VITAMIN D) 25 MCG (1000 UNIT) tablet, Take 1,000 Units by mouth daily., Disp: ,  Rfl:    empagliflozin (JARDIANCE) 10 MG TABS tablet, Take 1 tablet (10 mg total) by mouth daily before breakfast., Disp: 90 tablet, Rfl: 3   FLUoxetine (PROZAC) 20 MG capsule, Take 20 mg by mouth daily., Disp: , Rfl:    losartan (COZAAR) 25 MG tablet, TAKE 1/2 (ONE-HALF) TABLET BY MOUTH AT BEDTIME, Disp: 45 tablet, Rfl: 3   metoprolol succinate (TOPROL XL) 25 MG 24 hr tablet, Take 1 tablet (25 mg total) by mouth at bedtime., Disp: 90 tablet, Rfl: 3   Multiple Vitamin (MULTIVITAMIN) tablet, Take 1 tablet by mouth daily., Disp: , Rfl:    raloxifene (EVISTA) 60 MG tablet, Take 60 mg by mouth daily., Disp: , Rfl:    spironolactone (ALDACTONE) 25 MG tablet, Take 0.5 tablets (12.5 mg total) by mouth daily., Disp: 45 tablet, Rfl: 3

## 2023-08-05 NOTE — Addendum Note (Signed)
 Encounter addended by: Howell Rucks, RDCS on: 08/05/2023 1:11 PM  Actions taken: Imaging Exam ended

## 2023-08-25 DIAGNOSIS — L570 Actinic keratosis: Secondary | ICD-10-CM | POA: Diagnosis not present

## 2023-08-25 DIAGNOSIS — L814 Other melanin hyperpigmentation: Secondary | ICD-10-CM | POA: Diagnosis not present

## 2023-08-25 DIAGNOSIS — L821 Other seborrheic keratosis: Secondary | ICD-10-CM | POA: Diagnosis not present

## 2023-08-25 DIAGNOSIS — D225 Melanocytic nevi of trunk: Secondary | ICD-10-CM | POA: Diagnosis not present

## 2023-09-11 ENCOUNTER — Other Ambulatory Visit (HOSPITAL_COMMUNITY): Payer: Self-pay | Admitting: Internal Medicine

## 2023-09-11 DIAGNOSIS — I5041 Acute combined systolic (congestive) and diastolic (congestive) heart failure: Secondary | ICD-10-CM

## 2023-09-11 DIAGNOSIS — R000 Tachycardia, unspecified: Secondary | ICD-10-CM

## 2023-09-26 ENCOUNTER — Ambulatory Visit (HOSPITAL_COMMUNITY)
Admission: RE | Admit: 2023-09-26 | Discharge: 2023-09-26 | Disposition: A | Discharge status: Home / Self Care | Source: Ambulatory Visit | Attending: Internal Medicine | Admitting: Internal Medicine

## 2023-09-26 ENCOUNTER — Encounter (HOSPITAL_COMMUNITY): Payer: Self-pay | Admitting: Internal Medicine

## 2023-09-26 VITALS — BP 110/68 | HR 73 | Wt 140.2 lb

## 2023-09-26 DIAGNOSIS — Z853 Personal history of malignant neoplasm of breast: Secondary | ICD-10-CM | POA: Insufficient documentation

## 2023-09-26 DIAGNOSIS — I428 Other cardiomyopathies: Secondary | ICD-10-CM | POA: Insufficient documentation

## 2023-09-26 DIAGNOSIS — R5383 Other fatigue: Secondary | ICD-10-CM

## 2023-09-26 DIAGNOSIS — Z9221 Personal history of antineoplastic chemotherapy: Secondary | ICD-10-CM | POA: Insufficient documentation

## 2023-09-26 DIAGNOSIS — Z8249 Family history of ischemic heart disease and other diseases of the circulatory system: Secondary | ICD-10-CM | POA: Diagnosis not present

## 2023-09-26 DIAGNOSIS — Z9012 Acquired absence of left breast and nipple: Secondary | ICD-10-CM | POA: Diagnosis not present

## 2023-09-26 DIAGNOSIS — E785 Hyperlipidemia, unspecified: Secondary | ICD-10-CM | POA: Insufficient documentation

## 2023-09-26 DIAGNOSIS — Z79899 Other long term (current) drug therapy: Secondary | ICD-10-CM | POA: Diagnosis not present

## 2023-09-26 DIAGNOSIS — I5022 Chronic systolic (congestive) heart failure: Secondary | ICD-10-CM | POA: Diagnosis not present

## 2023-09-26 DIAGNOSIS — Z923 Personal history of irradiation: Secondary | ICD-10-CM | POA: Insufficient documentation

## 2023-09-26 LAB — BASIC METABOLIC PANEL WITH GFR
Anion gap: 10 (ref 5–15)
BUN: 18 mg/dL (ref 8–23)
CO2: 22 mmol/L (ref 22–32)
Calcium: 9.5 mg/dL (ref 8.9–10.3)
Chloride: 107 mmol/L (ref 98–111)
Creatinine, Ser: 0.79 mg/dL (ref 0.44–1.00)
GFR, Estimated: >60 mL/min (ref >60)
Glucose, Bld: 87 mg/dL (ref 70–99)
Potassium: 4.6 mmol/L (ref 3.5–5.1)
Sodium: 139 mmol/L (ref 135–145)

## 2023-09-26 LAB — BRAIN NATRIURETIC PEPTIDE: B Natriuretic Peptide: 93 pg/mL (ref 0.0–100.0)

## 2023-09-26 NOTE — Patient Instructions (Signed)
 Great to see you today!!!  Labs done today, your results will be available in MyChart, we will contact you for abnormal readings.  Your physician recommends that you schedule a follow-up appointment in: 6 months with an echocardiogram (November), *PLEASE CALL OUR OFFICE IN SEPTEMBER TO SCHEDULE THESE APPOINTMENTS**  If you have any questions or concerns before your next appointment please send us  a message through mychart or call our office at [phone].    TO LEAVE A MESSAGE FOR THE NURSE SELECT OPTION 2, PLEASE LEAVE A MESSAGE INCLUDING: YOUR NAME DATE OF BIRTH CALL BACK NUMBER REASON FOR CALL**this is important as we prioritize the call backs  YOU WILL RECEIVE A CALL BACK THE SAME DAY AS LONG AS YOU CALL BEFORE 4:00 PM  At the [facility], you and your health needs are our priority. As part of our continuing mission to provide you with exceptional heart care, we have created designated Provider Care Teams. These Care Teams include your primary Cardiologist (physician) and Advanced Practice Providers (APPs- Physician Assistants and Nurse Practitioners) who all work together to provide you with the care you need, when you need it.   You may see any of the following providers on your designated Care Team at your next follow up: Dr Jules Oar Dr Peder Bourdon Dr. Alwin Baars Dr. Arta Lark Amy Marijane Shoulders, NP Ruddy Corral, [place] [facility] [place], [place] Dennise Fitz, NP [place] Lee, NP Shawnee Dellen, NP Luster Salters, PharmD Bevely Brush, PharmD   Please be sure to bring in all your medications bottles to every appointment.    Thank you for choosing [facility]

## 2023-09-26 NOTE — Progress Notes (Signed)
 ADVANCED HF CLINIC  NOTE  Referring Physician: Dr Alroy Aspen  Primary Care: Dr Raquel Cables  Primary Cardiologist: Dr Alroy Aspen   Chief complaint: Heart failure  HPI:  Jenna Stewart  is a 73 year old with systolic heart failure, breast cancer (treated with chemo adriamycin 1994 and radiation), and hyperlipidmia.   Back in the fall she noticed a cough with shortness of breath. Due to progressive dyspnea + orthophnea. PCP started antibiotics.   Echo 4/23 EF 25-30%  She was referred to Dr Alroy Aspen. He started GDMT.  BB and ARNi  stopped due to hypotension. SBP at home 90s .  11/2021 Echo EF 20-25% RV mildly reduced.    [facility] 7/27 showed normal cors, severe NICM EF 30% with well compensated hemodynamics Ao = 110/51 (76) LV = 104/4 RA = 1 RV = 23/1 PA = 15/4 (10) PCW = 1 Fick cardiac output/index = 6.4/3.77 Thermo CO/CI = 4.7/2.8 PVR = 1.4 WU FA sat = 93% PA sat = 74%, 75% SVC sat = 81%  cMRI 8/23: EF 37% RVEF 47% Moderate MR   Echo  08/24/22 EF 30-35%   Echo 12/24 EF 35-40% (I think closer to 40%) Personally reviewed   Here for f/u. Previously valsartan  switched to losartan  12.5 due to low BP. Feeling a lot better. Fatigue improving. Takes more rests as needed. SBP ranging 95-105. Able to work in garden without too much problem. Goes to the store without problem    Past Medical History:  Diagnosis Date   Anxiety    Breast cancer (HCC)    Cancer (HCC)    Cardiomyopathy (HCC)    History of colonic polyps    Hyperlipemia    Osteopenia     Current Outpatient Medications  Medication Sig Dispense Refill   calcium carbonate 1250 MG capsule Take 1,250 mg by mouth 2 (two) times daily with a meal.     cholecalciferol (VITAMIN D) 25 MCG (1000 UNIT) tablet Take 1,000 Units by mouth daily.     empagliflozin  (JARDIANCE ) 10 MG TABS tablet TAKE 1 TABLET BY MOUTH ONCE DAILY BEFORE BREAKFAST 90 tablet 3   FLUoxetine (PROZAC) 20 MG capsule Take 20 mg by mouth daily.     losartan  (COZAAR ) 25  MG tablet TAKE 1/2 (ONE-HALF) TABLET BY MOUTH AT BEDTIME 45 tablet 3   metoprolol  succinate (TOPROL  XL) 25 MG 24 hr tablet Take 1 tablet (25 mg total) by mouth at bedtime. 90 tablet 3   Multiple Vitamin (MULTIVITAMIN) tablet Take 1 tablet by mouth daily.     raloxifene (EVISTA) 60 MG tablet Take 60 mg by mouth daily.     spironolactone  (ALDACTONE ) 25 MG tablet Take 0.5 tablets (12.5 mg total) by mouth daily. 45 tablet 3   No current facility-administered medications for this encounter.    No Known Allergies    Social History   Socioeconomic History   Marital status: Married    Spouse name: Not on file   Number of children: Not on file   Years of education: Not on file   Highest education level: Not on file  Occupational History   Not on file  Tobacco Use   Smoking status: Never   Smokeless tobacco: Not on file  Substance and Sexual Activity   Alcohol use: Never   Drug use: Not on file   Sexual activity: Not on file  Other Topics Concern   Not on file  Social History Narrative   Not on file   Social Drivers of  Health   Financial Resource Strain: Not on file  Food Insecurity: Not on file  Transportation Needs: Not on file  Physical Activity: Not on file  Stress: Not on file  Social Connections: Not on file  Intimate Partner Violence: Not on file     Family History  Problem Relation Age of Onset   Ulcerative colitis Father    Hypertension Sister    Diabetes Sister    Uterine cancer Maternal Grandmother     Vitals:   09/26/23 0921  BP: 110/68  Pulse: 73  SpO2: 97%  Weight: 63.6 kg (140 lb 3.2 oz)   PHYSICAL EXAM: General:  Well appearing. No resp difficulty HEENT: normal Neck: supple. no JVD. Carotids 2+ bilat; no bruits. No lymphadenopathy or thryomegaly appreciated. Cor: PMI nondisplaced. Regular rate & rhythm. No rubs, gallops or murmurs. Lungs: clear Abdomen: soft, nontender, nondistended. No hepatosplenomegaly. No bruits or masses. Good bowel  sounds. Extremities: no cyanosis, clubbing, rash, edema Neuro: alert & orientedx3, cranial nerves grossly intact. moves all 4 extremities w/o difficulty. Affect pleasant  ECG: NSR 64 No ST-T wave abnormalities. Personally reviewed     ASSESSMENT & PLAN:  1. 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 No further details available  - Echo EF 20-25%.  - cMRI 8/23: EF 37% RVEF 47% Moderate MR Suspect delayed adriamycin toxicity.  - Cath 7/23 no CAD - Echo 08/24/22 EF 30-35%  - Echo 12/24 EF 35-40% (I think closer to 40%) Personally reviewed - Improved NYHA II - Volume ok  - Continue losartan  12.5 mg qhs - Continue jardiance  10 mg daily  - Continue spironolactone  12.5mg  but move to qhs - Continue Toprol  XL 25 qhs - Low BP prohibits further GDMT titratioon - EF > 35% no role for ICD at this point - Labs today - RTC in 6months with echo   2. Breast Cancer - S/P L mastectomy - Treated with adriamycin 1994 - stable    Masai Kidd MD 9:49 AM'

## 2023-09-26 NOTE — Addendum Note (Signed)
 Encounter addended by: Glorietta Lark, RN on: 09/26/2023 10:18 AM  Actions taken: Diagnosis association updated, Order list changed, Clinical Note Signed, Charge Capture section accepted

## 2023-10-11 DIAGNOSIS — F419 Anxiety disorder, unspecified: Secondary | ICD-10-CM | POA: Diagnosis not present

## 2023-10-11 DIAGNOSIS — E7800 Pure hypercholesterolemia, unspecified: Secondary | ICD-10-CM | POA: Diagnosis not present

## 2023-10-11 DIAGNOSIS — M8588 Other specified disorders of bone density and structure, other site: Secondary | ICD-10-CM | POA: Diagnosis not present

## 2023-10-11 DIAGNOSIS — Z853 Personal history of malignant neoplasm of breast: Secondary | ICD-10-CM | POA: Diagnosis not present

## 2023-10-11 DIAGNOSIS — Z0000 Encounter for general adult medical examination without abnormal findings: Secondary | ICD-10-CM | POA: Diagnosis not present

## 2023-10-11 DIAGNOSIS — I5022 Chronic systolic (congestive) heart failure: Secondary | ICD-10-CM | POA: Diagnosis not present

## 2023-10-18 DIAGNOSIS — Z1231 Encounter for screening mammogram for malignant neoplasm of breast: Secondary | ICD-10-CM | POA: Diagnosis not present

## 2024-03-15 ENCOUNTER — Other Ambulatory Visit (HOSPITAL_COMMUNITY): Payer: Self-pay | Admitting: Internal Medicine

## 2024-04-11 DIAGNOSIS — D485 Neoplasm of uncertain behavior of skin: Secondary | ICD-10-CM | POA: Diagnosis not present

## 2024-04-11 DIAGNOSIS — L538 Other specified erythematous conditions: Secondary | ICD-10-CM | POA: Diagnosis not present

## 2024-04-11 DIAGNOSIS — L988 Other specified disorders of the skin and subcutaneous tissue: Secondary | ICD-10-CM | POA: Diagnosis not present

## 2024-04-16 ENCOUNTER — Other Ambulatory Visit (HOSPITAL_COMMUNITY): Payer: Self-pay | Admitting: Internal Medicine

## 2024-04-30 NOTE — Progress Notes (Signed)
 ADVANCED HF CLINIC  NOTE  Referring Physician: Dr Alveta  Primary Care: Dr Arloa  Primary Cardiologist: Dr Alveta   Chief complaint: Heart failure  HPI:  Jenna Stewart  is a 73 year old with systolic heart failure, breast cancer (treated with chemo adriamycin 1994 and radiation), and hyperlipidmia.   Back in the fall she noticed a cough with shortness of breath. Due to progressive dyspnea + orthophnea. PCP started antibiotics.   Echo 4/23 EF 25-30%  She was referred to Dr Alveta. He started GDMT.  BB and ARNi  stopped due to hypotension. SBP at home 90s .  11/2021 Echo EF 20-25% RV mildly reduced.    [facility] 7/27 showed normal cors, severe NICM EF 30% with well compensated hemodynamics Ao = 110/51 (76) LV = 104/4 RA = 1 RV = 23/1 PA = 15/4 (10) PCW = 1 Fick cardiac output/index = 6.4/3.77 Thermo CO/CI = 4.7/2.8 PVR = 1.4 WU FA sat = 93% PA sat = 74%, 75% SVC sat = 81%  cMRI 8/23: EF 37% RVEF 47% Moderate MR   Echo  08/24/22 EF 30-35%   Echo 12/24 EF 35-40% (I think closer to 40%) Personally reviewed   Here for f/u. Previously valsartan  switched to losartan  12.5 due to low BP. Feeling a lot better. Fatigue improving. Takes more rests as needed. SBP ranging 95-105. Able to work in garden without too much problem. Goes to the store without problem    Past Medical History:  Diagnosis Date   Anxiety    Breast cancer (HCC)    Cancer (HCC)    Cardiomyopathy (HCC)    History of colonic polyps    Hyperlipemia    Osteopenia     Current Outpatient Medications  Medication Sig Dispense Refill   calcium  carbonate 1250 MG capsule Take 1,250 mg by mouth 2 (two) times daily with a meal.     cholecalciferol (VITAMIN D) 25 MCG (1000 UNIT) tablet Take 1,000 Units by mouth daily.     empagliflozin  (JARDIANCE ) 10 MG TABS tablet TAKE 1 TABLET BY MOUTH ONCE DAILY BEFORE BREAKFAST 90 tablet 3   FLUoxetine (PROZAC) 20 MG capsule Take 20 mg by mouth daily.     losartan  (COZAAR ) 25  MG tablet TAKE 1/2 (ONE-HALF) TABLET BY MOUTH AT BEDTIME 45 tablet 0   metoprolol  succinate (TOPROL -XL) 25 MG 24 hr tablet TAKE 1 TABLET BY MOUTH AT BEDTIME 90 tablet 0   Multiple Vitamin (MULTIVITAMIN) tablet Take 1 tablet by mouth daily.     raloxifene (EVISTA) 60 MG tablet Take 60 mg by mouth daily.     spironolactone  (ALDACTONE ) 25 MG tablet Take 1/2 (one-half) tablet by mouth once daily 45 tablet 3   No current facility-administered medications for this encounter.    No Known Allergies    Social History   Socioeconomic History   Marital status: Married    Spouse name: Not on file   Number of children: Not on file   Years of education: Not on file   Highest education level: Not on file  Occupational History   Not on file  Tobacco Use   Smoking status: Never   Smokeless tobacco: Not on file  Substance and Sexual Activity   Alcohol use: Never   Drug use: Not on file   Sexual activity: Not on file  Other Topics Concern   Not on file  Social History Narrative   Not on file   Social Drivers of Dispensing Optician Resource  Strain: Not on file  Food Insecurity: Not on file  Transportation Needs: Not on file  Physical Activity: Not on file  Stress: Not on file  Social Connections: Not on file  Intimate Partner Violence: Not on file     Family History  Problem Relation Age of Onset   Ulcerative colitis Father    Hypertension Sister    Diabetes Sister    Uterine cancer Maternal Grandmother     There were no vitals filed for this visit.  PHYSICAL EXAM: General:  Well appearing. No resp difficulty HEENT: normal Neck: supple. no JVD. Carotids 2+ bilat; no bruits. No lymphadenopathy or thryomegaly appreciated. Cor: PMI nondisplaced. Regular rate & rhythm. No rubs, gallops or murmurs. Lungs: clear Abdomen: soft, nontender, nondistended. No hepatosplenomegaly. No bruits or masses. Good bowel sounds. Extremities: no cyanosis, clubbing, rash, edema Neuro: alert &  orientedx3, cranial nerves grossly intact. moves all 4 extremities w/o difficulty. Affect pleasant  ECG: NSR 64 No ST-T wave abnormalities. Personally reviewed     ASSESSMENT & PLAN:  1. 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 No further details available  - Echo EF 20-25%.  - cMRI 8/23: EF 37% RVEF 47% Moderate MR Suspect delayed adriamycin toxicity.  - Cath 7/23 no CAD - Echo 08/24/22 EF 30-35%  - Echo 12/24 EF 35-40% (I think closer to 40%) Personally reviewed - Improved NYHA II - Volume ok  - Continue losartan  12.5 mg qhs - Continue jardiance  10 mg daily  - Continue spironolactone  12.5mg  but move to qhs - Continue Toprol  XL 25 qhs - Low BP prohibits further GDMT titratioon - EF > 35% no role for ICD at this point   2. Breast Cancer - S/P L mastectomy - Treated with adriamycin 1994 - stable    Toribio Fuel MD 9:53 PM'

## 2024-05-01 ENCOUNTER — Ambulatory Visit (HOSPITAL_COMMUNITY)
Admission: RE | Admit: 2024-05-01 | Discharge: 2024-05-01 | Disposition: A | Source: Ambulatory Visit | Attending: *Deleted | Admitting: *Deleted

## 2024-05-01 ENCOUNTER — Encounter (HOSPITAL_COMMUNITY): Payer: Self-pay | Admitting: Internal Medicine

## 2024-05-01 ENCOUNTER — Inpatient Hospital Stay (HOSPITAL_COMMUNITY)
Admission: RE | Admit: 2024-05-01 | Discharge: 2024-05-01 | Disposition: A | Source: Ambulatory Visit | Attending: Internal Medicine | Admitting: Internal Medicine

## 2024-05-01 VITALS — BP 122/64 | HR 72 | Wt 138.4 lb

## 2024-05-01 DIAGNOSIS — I5022 Chronic systolic (congestive) heart failure: Secondary | ICD-10-CM

## 2024-05-01 DIAGNOSIS — E782 Mixed hyperlipidemia: Secondary | ICD-10-CM | POA: Diagnosis not present

## 2024-05-01 LAB — ECHOCARDIOGRAM COMPLETE
Area-P 1/2: 2.39 cm2
Calc EF: 40.5 %
S' Lateral: 4.8 cm
Single Plane A2C EF: 46.9 %
Single Plane A4C EF: 36.5 %

## 2024-05-01 MED ORDER — ROSUVASTATIN CALCIUM 5 MG PO TABS: 5.0000 mg | ORAL_TABLET | Freq: Every day | ORAL | 3 refills | Status: AC

## 2024-05-01 NOTE — Addendum Note (Signed)
 Encounter addended by: Marcelina Lisa HERO, RN on: 05/01/2024 10:18 AM  Actions taken: Order list changed, Clinical Note Signed

## 2024-05-01 NOTE — Patient Instructions (Signed)
 START Crestor  5 mg daily.  Your physician recommends that you schedule a follow-up appointment in: 6 months ( June 2026) ** PLEASE CALL THE OFFICE IN [place] TO ARRANGE YOUR FOLLOW UP APPOINTMENT.**  If you have any questions or concerns before your next appointment please send us  a message through [place] or call our office at [phone].    TO LEAVE A MESSAGE FOR THE NURSE SELECT OPTION 2, PLEASE LEAVE A MESSAGE INCLUDING: YOUR NAME DATE OF BIRTH CALL BACK NUMBER REASON FOR CALL**this is important as we prioritize the call backs  YOU WILL RECEIVE A CALL BACK THE SAME DAY AS LONG AS YOU CALL BEFORE 4:00 PM  At the [facility], you and your health needs are our priority. As part of our continuing mission to provide you with exceptional heart care, we have created designated Provider Care Teams. These Care Teams include your primary Cardiologist (physician) and Advanced Practice Providers (APPs- Physician Assistants and Nurse Practitioners) who all work together to provide you with the care you need, when you need it.   You may see any of the following providers on your designated Care Team at your next follow up: Dr Toribio Fuel Dr Ezra Shuck Dr. Morene Brownie Greig Mosses, NP Caffie Shed, [place] [facility] [place], [place] Beckey Coe, NP Jordan Lee, NP Ellouise Class, NP Tinnie Redman, PharmD Jaun Bash, PharmD   Please be sure to bring in all your medications bottles to every appointment.    Thank you for choosing [facility]

## 2024-06-08 ENCOUNTER — Other Ambulatory Visit (HOSPITAL_COMMUNITY): Payer: Self-pay | Admitting: Internal Medicine
